# Patient Record
Sex: Male | Born: 1980 | Race: Black or African American | Hispanic: No | Marital: Single | Smoking: Former smoker
Health system: Southern US, Community
[De-identification: ages and names within clinical notes are randomized; demographics above are authoritative.]

## PROBLEM LIST (undated history)

## (undated) DIAGNOSIS — C029 Malignant neoplasm of tongue, unspecified: Secondary | ICD-10-CM

## (undated) HISTORY — PX: TONGUE SURGERY: SHX810

---
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
## 2000-10-22 ENCOUNTER — Emergency Department (HOSPITAL_COMMUNITY): Admission: EM | Admit: 2000-10-22 | Discharge: 2000-10-22 | Payer: Self-pay | Admitting: Emergency Medicine

## 2005-09-08 ENCOUNTER — Emergency Department (HOSPITAL_COMMUNITY): Admission: EM | Admit: 2005-09-08 | Discharge: 2005-09-08 | Payer: Self-pay | Admitting: Family Medicine

## 2006-06-27 ENCOUNTER — Emergency Department (HOSPITAL_COMMUNITY): Admission: EM | Admit: 2006-06-27 | Discharge: 2006-06-27 | Payer: Self-pay | Admitting: Emergency Medicine

## 2008-11-05 ENCOUNTER — Emergency Department (HOSPITAL_COMMUNITY): Admission: EM | Admit: 2008-11-05 | Discharge: 2008-11-05 | Payer: Self-pay | Admitting: Family Medicine

## 2008-11-21 ENCOUNTER — Emergency Department (HOSPITAL_COMMUNITY): Admission: EM | Admit: 2008-11-21 | Discharge: 2008-11-21 | Payer: Self-pay | Admitting: Emergency Medicine

## 2008-11-26 ENCOUNTER — Emergency Department (HOSPITAL_COMMUNITY): Admission: EM | Admit: 2008-11-26 | Discharge: 2008-11-26 | Payer: Self-pay | Admitting: Family Medicine

## 2010-04-18 ENCOUNTER — Emergency Department (HOSPITAL_COMMUNITY): Admission: EM | Admit: 2010-04-18 | Discharge: 2010-04-18 | Payer: Self-pay | Admitting: Emergency Medicine

## 2010-09-25 ENCOUNTER — Emergency Department (HOSPITAL_COMMUNITY)
Admission: EM | Admit: 2010-09-25 | Discharge: 2010-09-25 | Discharge status: Against Medical Advice | Payer: Self-pay | Attending: Emergency Medicine | Admitting: Emergency Medicine

## 2010-09-25 DIAGNOSIS — K047 Periapical abscess without sinus: Secondary | ICD-10-CM | POA: Insufficient documentation

## 2011-01-17 ENCOUNTER — Emergency Department (HOSPITAL_COMMUNITY)
Admission: EM | Admit: 2011-01-17 | Discharge: 2011-01-17 | Disposition: A | Discharge status: Home / Self Care | Payer: Self-pay | Attending: Emergency Medicine | Admitting: Emergency Medicine

## 2011-01-17 DIAGNOSIS — K089 Disorder of teeth and supporting structures, unspecified: Secondary | ICD-10-CM | POA: Insufficient documentation

## 2011-01-17 DIAGNOSIS — H9209 Otalgia, unspecified ear: Secondary | ICD-10-CM | POA: Insufficient documentation

## 2011-01-17 DIAGNOSIS — K029 Dental caries, unspecified: Secondary | ICD-10-CM | POA: Insufficient documentation

## 2011-03-14 ENCOUNTER — Emergency Department (HOSPITAL_COMMUNITY)
Admission: EM | Admit: 2011-03-14 | Discharge: 2011-03-14 | Disposition: A | Discharge status: Home / Self Care | Payer: Self-pay | Attending: Emergency Medicine | Admitting: Emergency Medicine

## 2011-03-14 DIAGNOSIS — K029 Dental caries, unspecified: Secondary | ICD-10-CM | POA: Insufficient documentation

## 2011-03-14 DIAGNOSIS — K089 Disorder of teeth and supporting structures, unspecified: Secondary | ICD-10-CM | POA: Insufficient documentation

## 2011-03-14 DIAGNOSIS — R221 Localized swelling, mass and lump, neck: Secondary | ICD-10-CM | POA: Insufficient documentation

## 2011-03-14 DIAGNOSIS — R220 Localized swelling, mass and lump, head: Secondary | ICD-10-CM | POA: Insufficient documentation

## 2011-03-14 DIAGNOSIS — K047 Periapical abscess without sinus: Secondary | ICD-10-CM | POA: Insufficient documentation

## 2011-07-04 ENCOUNTER — Emergency Department (HOSPITAL_COMMUNITY)
Admission: EM | Admit: 2011-07-04 | Discharge: 2011-07-04 | Disposition: A | Discharge status: Home / Self Care | Payer: Self-pay | Attending: Emergency Medicine | Admitting: Emergency Medicine

## 2011-07-04 ENCOUNTER — Encounter (HOSPITAL_COMMUNITY): Payer: Self-pay | Admitting: *Deleted

## 2011-07-04 DIAGNOSIS — R6884 Jaw pain: Secondary | ICD-10-CM | POA: Insufficient documentation

## 2011-07-04 DIAGNOSIS — R220 Localized swelling, mass and lump, head: Secondary | ICD-10-CM | POA: Insufficient documentation

## 2011-07-04 DIAGNOSIS — K029 Dental caries, unspecified: Secondary | ICD-10-CM | POA: Insufficient documentation

## 2011-07-04 DIAGNOSIS — K047 Periapical abscess without sinus: Secondary | ICD-10-CM | POA: Insufficient documentation

## 2011-07-04 MED ORDER — PENICILLIN V POTASSIUM 500 MG PO TABS: 500.0000 mg | ORAL_TABLET | Freq: Four times a day (QID) | ORAL | Status: AC

## 2011-07-04 MED ORDER — OXYCODONE-ACETAMINOPHEN 5-325 MG PO TABS: 2.0000 | ORAL_TABLET | ORAL | Status: AC | PRN

## 2011-07-04 NOTE — ED Notes (Signed)
Pt had right lower denatl pain for the last couple of days,  Now he has began having swelling of right jaw.  No sob with this.  Pt reports chills, no fever.

## 2011-07-04 NOTE — ED Notes (Signed)
Pt states that he had pain in his right molar that started approx 3 days ago. Pt has noted darkening and deformity in the tooth. Pt has swelling in jaw that started today and pain that goes into his neck.

## 2011-07-04 NOTE — ED Provider Notes (Signed)
History     CSN: [id number]  Arrival date & time 07/04/11  1312   First MD Initiated Contact with Patient 07/04/11 1501      Chief Complaint  Patient presents with  . Jaw Pain    (Consider location/radiation/quality/duration/timing/severity/associated sxs/prior treatment) The history is provided by the patient.   patient here with right lower jaw pain for the past 3-4 days. Pain to his right lower second molar. No fever or pain is described as sharp and worse with chewing. Some jaw swelling down to the right lower face. No medications used prior to arrival  History reviewed. No pertinent past medical history.  History reviewed. No pertinent past surgical history.  No family history on file.  History  Substance Use Topics  . Smoking status: Current Everyday Smoker    Types: Cigarettes  . Smokeless tobacco: Not on file  . Alcohol Use: Yes      Review of Systems  All other systems reviewed and are negative.    Allergies  Review of patient's allergies indicates no known allergies.  Home Medications   Current Outpatient Rx  Name Route Sig Dispense Refill  . BENZOCAINE 10 % MT GEL Mouth/Throat Use as directed 1 application in the mouth or throat every hour as needed. For pain    . IBUPROFEN 200 MG PO TABS Oral Take 600 mg by mouth every 6 (six) hours as needed. For pain      BP 132/74  Pulse 102  Temp(Src) 98.9 F (37.2 C) (Oral)  Resp 16  SpO2 99%  Physical Exam  Nursing note and vitals reviewed. Constitutional: He is oriented to person, place, and time. He appears well-developed and well-nourished.  Non-toxic appearance.  HENT:  Head: Normocephalic and atraumatic.  Mouth/Throat: Dental abscesses and dental caries present.    Eyes: Conjunctivae are normal. Pupils are equal, round, and reactive to light.  Neck: Normal range of motion.  Cardiovascular: Normal rate.   Pulmonary/Chest: Effort normal.  Neurological: He is alert and oriented to person, place,  and time.  Skin: Skin is warm and dry.  Psychiatric: He has a normal mood and affect.    ED Course  Procedures (including critical care time)  Labs Reviewed - No data to display No results found.   No diagnosis found.    MDM  Patient to be treated for dental abscess        Toy Baker, MD 07/04/11 1600

## 2013-02-20 ENCOUNTER — Encounter (HOSPITAL_COMMUNITY): Payer: Self-pay | Admitting: Emergency Medicine

## 2013-02-20 ENCOUNTER — Emergency Department (HOSPITAL_COMMUNITY)
Admission: EM | Admit: 2013-02-20 | Discharge: 2013-02-20 | Disposition: A | Discharge status: Home / Self Care | Payer: Self-pay | Attending: Emergency Medicine | Admitting: Emergency Medicine

## 2013-02-20 DIAGNOSIS — H9202 Otalgia, left ear: Secondary | ICD-10-CM

## 2013-02-20 DIAGNOSIS — H902 Conductive hearing loss, unspecified: Secondary | ICD-10-CM | POA: Insufficient documentation

## 2013-02-20 DIAGNOSIS — Y939 Activity, unspecified: Secondary | ICD-10-CM | POA: Insufficient documentation

## 2013-02-20 DIAGNOSIS — IMO0002 Reserved for concepts with insufficient information to code with codable children: Secondary | ICD-10-CM | POA: Insufficient documentation

## 2013-02-20 DIAGNOSIS — T169XXA Foreign body in ear, unspecified ear, initial encounter: Secondary | ICD-10-CM | POA: Insufficient documentation

## 2013-02-20 DIAGNOSIS — H9209 Otalgia, unspecified ear: Secondary | ICD-10-CM | POA: Insufficient documentation

## 2013-02-20 DIAGNOSIS — J029 Acute pharyngitis, unspecified: Secondary | ICD-10-CM | POA: Insufficient documentation

## 2013-02-20 DIAGNOSIS — F17200 Nicotine dependence, unspecified, uncomplicated: Secondary | ICD-10-CM | POA: Insufficient documentation

## 2013-02-20 DIAGNOSIS — Y929 Unspecified place or not applicable: Secondary | ICD-10-CM | POA: Insufficient documentation

## 2013-02-20 DIAGNOSIS — T162XXA Foreign body in left ear, initial encounter: Secondary | ICD-10-CM

## 2013-02-20 MED ORDER — CETIRIZINE-PSEUDOEPHEDRINE ER 5-120 MG PO TB12: 1.0000 | ORAL_TABLET | Freq: Two times a day (BID) | ORAL | Status: DC

## 2013-02-20 MED ORDER — OXYMETAZOLINE HCL 0.05 % NA SOLN: 2.0000 | Freq: Two times a day (BID) | NASAL | Status: DC

## 2013-02-20 MED ORDER — HYDROCODONE-ACETAMINOPHEN 5-325 MG PO TABS
1.0000 | ORAL_TABLET | Freq: Once | ORAL | Status: AC
  Administered 2013-02-20: 1 via ORAL
  Filled 2013-02-20: qty 1

## 2013-02-20 MED ORDER — HYDROCODONE-ACETAMINOPHEN 5-325 MG PO TABS: 1.0000 | ORAL_TABLET | ORAL | Status: DC | PRN

## 2013-02-20 MED ORDER — AMOXICILLIN 500 MG PO CAPS: 500.0000 mg | ORAL_CAPSULE | Freq: Three times a day (TID) | ORAL | Status: DC

## 2013-02-20 NOTE — ED Provider Notes (Addendum)
CSN: [id number]     Arrival date & time 02/20/13  0024 History   First MD Initiated Contact with Patient 02/20/13 0034     Chief Complaint  Patient presents with  . Otalgia   HPI  History provided by the patient. The patient is a 32 year old male with no symptoms PMH presents with complaints of persistent and worsening left ear pain and discomfort. Patient reports initially having some left ear pains and discomforts one to 2 weeks ago with associated sore throat. His sore throat did improve slightly and he had some waxing and waning improvements of his ear. Over the last 2-3 days he has had significantly worsening left ear pain. Patient does report putting cotton ball and is here last night and believes there is still some cotton remaining in his ear. He has not had any drainage from the ear. He was using ibuprofen very frequently for the pain without much relief.   History reviewed. No pertinent past medical history. History reviewed. No pertinent past surgical history. No family history on file. History  Substance Use Topics  . Smoking status: Current Every Day Smoker    Types: Cigarettes  . Smokeless tobacco: Not on file  . Alcohol Use: Yes    Review of Systems  Constitutional: Negative for fever, chills and diaphoresis.  HENT: Positive for hearing loss, ear pain and sore throat. Negative for congestion, rhinorrhea, tinnitus and ear discharge.   Respiratory: Negative for cough.   All other systems reviewed and are negative.    Allergies  Review of patient's allergies indicates no known allergies.  Home Medications   Current Outpatient Rx  Name  Route  Sig  Dispense  Refill  . benzocaine (ORAJEL) 10 % mucosal gel   Mouth/Throat   Use as directed 1 application in the mouth or throat every hour as needed. For pain         . ibuprofen (ADVIL,MOTRIN) 200 MG tablet   Oral   Take 600 mg by mouth every 6 (six) hours as needed. For pain          BP 122/72  Temp(Src) 99 F  (37.2 C) (Oral)  Resp 14  SpO2 99% Physical Exam  Nursing note and vitals reviewed. Constitutional: He is oriented to person, place, and time. He appears well-developed and well-nourished.  HENT:  Head: Normocephalic and atraumatic.  Right Ear: Tympanic membrane and external ear normal.  Nose: Nose normal.  Mouth/Throat: Oropharynx is clear and moist.  Cotton in the external left auditory canal obstructing view of the TM.    After removal TM does not show any signs of perforation. There is erythema and bulging without purulent middle ear effusion.  Eyes: Conjunctivae and EOM are normal. Pupils are equal, round, and reactive to light.  Cardiovascular: Normal rate and regular rhythm.   Pulmonary/Chest: Effort normal and breath sounds normal. No respiratory distress. He has no wheezes. He has no rales.  Abdominal: Soft.  Musculoskeletal: Normal range of motion.  Neurological: He is alert and oriented to person, place, and time.  Skin: Skin is warm.  Psychiatric: He has a normal mood and affect. His behavior is normal.    ED Course  FOREIGN BODY REMOVAL Date/Time: 02/20/2013 12:45 AM Performed by: Angus Seller Authorized by: Angus Seller Consent: Verbal consent obtained. Risks and benefits: risks, benefits and alternatives were discussed Consent given by: patient Patient understanding: patient states understanding of the procedure being performed Patient consent: the patient's understanding of the procedure  matches consent given Patient identity confirmed: verbally with patient Time out: Immediately prior to procedure a "time out" was called to verify the correct patient, procedure, equipment, support staff and site/side marked as required. Body area: ear Location details: left ear Localization method: visualized Removal mechanism: alligator forceps 1 objects recovered. Objects recovered: Cotton ball/ Q-tip Post-procedure assessment: foreign body removed Patient  tolerance: Patient tolerated the procedure well with no immediate complications.        MDM   1. Otalgia of left ear   2. Foreign body of ear, left, initial encounter      12:30 AM patient seen and evaluated. Patient appears well in no acute distress. Does not appear severely ill or toxic.    Angus Seller, PA-C 02/20/13 0435    Angus Seller, PA-C 03/02/13 2008

## 2013-02-20 NOTE — ED Notes (Signed)
Pt. reports left earache for 2 days , denies injury , no drainage.

## 2013-02-20 NOTE — ED Notes (Signed)
Left ear pain ongoing for months but worsened 2 days ago.  Area around ear now painful as well.  Pain untouched by tylenol. Denies ear drainage.  Admits to using cotton tips to clean ears.   10/10 pain per pt.

## 2013-02-21 NOTE — ED Provider Notes (Signed)
Medical screening examination/treatment/procedure(s) were performed by non-physician practitioner and as supervising physician I was immediately available for consultation/collaboration.   Candyce Churn, MD 02/21/13 [phone]

## 2013-03-03 NOTE — ED Provider Notes (Signed)
Medical screening examination/treatment/procedure(s) were performed by non-physician practitioner and as supervising physician I was immediately available for consultation/collaboration.   Candyce Churn, MD 03/03/13 [phone]

## 2014-01-04 ENCOUNTER — Emergency Department (HOSPITAL_COMMUNITY)
Admission: EM | Admit: 2014-01-04 | Discharge: 2014-01-04 | Disposition: A | Discharge status: Home / Self Care | Payer: Self-pay | Attending: Emergency Medicine | Admitting: Emergency Medicine

## 2014-01-04 ENCOUNTER — Encounter (HOSPITAL_COMMUNITY): Payer: Self-pay | Admitting: Emergency Medicine

## 2014-01-04 ENCOUNTER — Emergency Department (HOSPITAL_COMMUNITY): Payer: Self-pay

## 2014-01-04 DIAGNOSIS — R221 Localized swelling, mass and lump, neck: Principal | ICD-10-CM

## 2014-01-04 DIAGNOSIS — F17200 Nicotine dependence, unspecified, uncomplicated: Secondary | ICD-10-CM | POA: Insufficient documentation

## 2014-01-04 DIAGNOSIS — R220 Localized swelling, mass and lump, head: Secondary | ICD-10-CM | POA: Insufficient documentation

## 2014-01-04 DIAGNOSIS — H9209 Otalgia, unspecified ear: Secondary | ICD-10-CM | POA: Insufficient documentation

## 2014-01-04 DIAGNOSIS — Z792 Long term (current) use of antibiotics: Secondary | ICD-10-CM | POA: Insufficient documentation

## 2014-01-04 DIAGNOSIS — K148 Other diseases of tongue: Secondary | ICD-10-CM

## 2014-01-04 DIAGNOSIS — Z79899 Other long term (current) drug therapy: Secondary | ICD-10-CM | POA: Insufficient documentation

## 2014-01-04 LAB — I-STAT CHEM 8, ED
BUN: 18 mg/dL (ref 6–23)
CREATININE: 1.4 mg/dL — AB (ref 0.50–1.35)
Calcium, Ion: 1.25 mmol/L — ABNORMAL HIGH (ref 1.12–1.23)
Chloride: 107 mEq/L (ref 96–112)
GLUCOSE: 87 mg/dL (ref 70–99)
HCT: 45 % (ref 39.0–52.0)
Hemoglobin: 15.3 g/dL (ref 13.0–17.0)
POTASSIUM: 4.2 meq/L (ref 3.7–5.3)
Sodium: 140 mEq/L (ref 137–147)
TCO2: 25 mmol/L (ref 0–100)

## 2014-01-04 MED ORDER — OXYCODONE-ACETAMINOPHEN 5-325 MG PO TABS: 1.0000 | ORAL_TABLET | ORAL | Status: DC | PRN

## 2014-01-04 MED ORDER — IOHEXOL 300 MG/ML  SOLN
80.0000 mL | Freq: Once | INTRAMUSCULAR | Status: AC | PRN
  Administered 2014-01-04: 80 mL via INTRAVENOUS

## 2014-01-04 MED ORDER — SODIUM CHLORIDE 0.9 % IV BOLUS (SEPSIS)
1000.0000 mL | Freq: Once | INTRAVENOUS | Status: AC
  Administered 2014-01-04: 1000 mL via INTRAVENOUS

## 2014-01-04 MED ORDER — IBUPROFEN 800 MG PO TABS: 800.0000 mg | ORAL_TABLET | Freq: Three times a day (TID) | ORAL | Status: DC

## 2014-01-04 NOTE — ED Notes (Signed)
Patient states he has been having L ear pain x 1 year.  Patient states he has been seen 2 x for same.  Patient states that he gets sick when he takes pain medication.

## 2014-01-04 NOTE — Discharge Instructions (Signed)
Call Dr. Janace Hoard or another Ear, Nose, Throat specialist for further evaluation of your neck/tongue mass. Call for a follow up appointment with a Family or Primary Care Provider.  Return if Symptoms worsen.   Take medication as prescribed.  Stop smoking cigarettes.

## 2014-01-04 NOTE — ED Provider Notes (Signed)
CSN: [id number]     Arrival date & time 01/04/14  1159 History  This chart was scribed for non-physician practitioner, Harvie Heck, PA-C working with Orpah Greek, MD by Frederich Balding, ED scribe. This patient was seen in room TR08C/TR08C and the patient's care was started at 12:59 PM.   Chief Complaint  Patient presents with  . Otalgia   HPI Comments: Joel Mathis is a 33 y.o. male who presents to the Emergency Department complaining of constant left ear pain that started one year ago. Reports the pain never subsides. States he has been seen twice for the same in the past and told nothing was wrong with his ear. Pt does not have a PCP and has not been evaluated by ENT. Denies fever, chills, ear discharge, sore throat, pain with swallowing. Pt smokes cigarettes daily.  The history is provided by the patient. No language interpreter was used.    History reviewed. No pertinent past medical history. History reviewed. No pertinent past surgical history. No family history on file. History  Substance Use Topics  . Smoking status: Current Every Day Smoker    Types: Cigarettes  . Smokeless tobacco: Not on file  . Alcohol Use: Yes    Review of Systems  Constitutional: Negative for fever and chills.  HENT: Positive for ear pain. Negative for ear discharge and sore throat.   All other systems reviewed and are negative.  Allergies  Review of patient's allergies indicates no known allergies.  Home Medications   Prior to Admission medications   Medication Sig Start Date End Date Taking? Authorizing Provider  amoxicillin (AMOXIL) 500 MG capsule Take 1 capsule (500 mg total) by mouth 3 (three) times daily. 02/20/13   Ruthell Rummage Dammen, PA-C  cetirizine-pseudoephedrine (ZYRTEC-D) 5-120 MG per tablet Take 1 tablet by mouth 2 (two) times daily. 02/20/13   Ruthell Rummage Dammen, PA-C  HYDROcodone-acetaminophen (NORCO) 5-325 MG per tablet Take 1 tablet by mouth every 4 (four) hours as needed for  pain. 02/20/13   Ruthell Rummage Dammen, PA-C  oxymetazoline (AFRIN NASAL SPRAY) 0.05 % nasal spray Place 2 sprays into the nose 2 (two) times daily. 02/20/13   Ruthell Rummage Dammen, PA-C   BP 115/80  Pulse 82  Temp(Src) 97.6 F (36.4 C)  Resp 16  SpO2 100%  Physical Exam  Nursing note and vitals reviewed. Constitutional: He is oriented to person, place, and time. He appears well-developed and well-nourished.  Non-toxic appearance. He does not have a sickly appearance. He does not appear ill. No distress.  HENT:  Head: Normocephalic and atraumatic.  Left Ear: External ear normal.  Nose: Nose normal.  Right TM is completely occluded by cerumen. Left posterior oropharynx swelling without exudate.   Eyes: Conjunctivae and EOM are normal.  Neck: Neck supple.  Cardiovascular: Normal rate.   Pulmonary/Chest: Effort normal. No respiratory distress.  Musculoskeletal: Normal range of motion.  Lymphadenopathy:       Head (right side): No submandibular, no tonsillar and no preauricular adenopathy present.       Head (left side): No submandibular, no tonsillar and no preauricular adenopathy present.  No lymphadenopathy.   Neurological: He is alert and oriented to person, place, and time.  Skin: Skin is warm and dry. He is not diaphoretic.  Psychiatric: He has a normal mood and affect. His behavior is normal.    ED Course  Procedures (including critical care time)  COORDINATION OF CARE: 1:03 PM-Discussed treatment plan which includes ENT follow up with  pt at bedside and pt agreed to plan.   Labs Review Labs Reviewed  I-STAT CHEM 8, ED - Abnormal; Notable for the following:    Creatinine, Ser 1.40 (*)    Calcium, Ion 1.25 (*)    All other components within normal limits    Imaging Review Ct Soft Tissue Neck W Contrast  01/04/2014   CLINICAL DATA:  Left ear pain.  EXAM: CT NECK WITH CONTRAST  TECHNIQUE: Multidetector CT imaging of the neck was performed using the standard protocol following the  bolus administration of intravenous contrast.  CONTRAST:  28mL OMNIPAQUE IOHEXOL 300 MG/ML  SOLN  COMPARISON:  None available.  FINDINGS: A well-defined base of tongue lesion measures 1.5 x 1.4 x 1.6 cm. Benign appearing submandibular lymph nodes measure up to 9 x 17 mm on the right and 9 x 16 mm on the left. Submandibular glands and parotid glands are within normal limits bilaterally.  Mild prominence of the jugulodigastric lymph nodes is symmetric. A single and large posterior left level 2 lymph node is ovoid, measuring 16 x 13 x 9 mm. No other significant adenopathy is present.  No other focal mucosal a submucosal lesions are present within the suprahyoid neck. The lesion does not extend into the pre epiglottic fat.  Multiple dental caries are evident bilaterally. There is significant periapical disease involving the right mandibular second and third molars (teeth #30 & 31). The more anterior lesion demonstrates significant cortical erosion of the lateral margin of the mandible.  Apart from the dental disease, the bone windows are unremarkable. The lung apices are clear.  IMPRESSION: 1. Well-defined hyperdense, likely enhancing 1.5 x 1.4 x 1.6 cm lesion at the left tongue base. The differential diagnosis includes a well-defined squamous cell carcinoma. Ectopic thyroid tissue is considered less likely given the paramedian location. 2. Borderline submandibular and level 2 lymph nodes as described. No clearly malignant nodes are evident. 3. No other significant submandibular disease.   Electronically Signed   By: Lawrence Santiago M.D.   On: 01/04/2014 15:44     EKG Interpretation None      MDM   Final diagnoses:  Tongue mass   Patient presents with left ear pain no sign of infection on exam. Left-sided posterior oropharynx with large mass. CT shows a well-defined hyperdense lesion at the left tongue base. All discussed CT results and need to follow up with ENT specialist the patient. Call Dr. Janace Hoard  office to inform staff the patient will be following up with them. Discussed lab results, imaging results, and treatment plan with the patient. Return precautions given. Reports understanding and no other concerns at this time.  Patient is stable for discharge at this time. Meds given in ED:  Medications  sodium chloride 0.9 % bolus 1,000 mL (1,000 mLs Intravenous New Bag/Given 01/04/14 1337)  iohexol (OMNIPAQUE) 300 MG/ML solution 80 mL (80 mLs Intravenous Contrast Given 01/04/14 1458)    Discharge Medication List as of 01/04/2014  4:23 PM    START taking these medications   Details  !! ibuprofen (ADVIL,MOTRIN) 800 MG tablet Take 1 tablet (800 mg total) by mouth 3 (three) times daily., Starting 01/04/2014, Until Discontinued, Print    oxyCODONE-acetaminophen (PERCOCET) 5-325 MG per tablet Take 1 tablet by mouth every 4 (four) hours as needed., Starting 01/04/2014, Until Discontinued, Print     !! - Potential duplicate medications found. Please discuss with provider.     I personally performed the services described in this documentation, which was  scribed in my presence. The recorded information has been reviewed and is accurate.  Harvie Heck, PA-C 01/05/14 1614

## 2014-01-07 NOTE — ED Provider Notes (Signed)
Medical screening examination/treatment/procedure(s) were performed by non-physician practitioner and as supervising physician I was immediately available for consultation/collaboration.  Orpah Greek, MD 01/07/14 [phone]

## 2014-01-27 ENCOUNTER — Encounter (HOSPITAL_COMMUNITY): Payer: Self-pay | Admitting: Emergency Medicine

## 2014-01-27 ENCOUNTER — Emergency Department (HOSPITAL_COMMUNITY)
Admission: EM | Admit: 2014-01-27 | Discharge: 2014-01-27 | Disposition: A | Discharge status: Home / Self Care | Payer: Self-pay | Attending: Emergency Medicine | Admitting: Emergency Medicine

## 2014-01-27 DIAGNOSIS — R220 Localized swelling, mass and lump, head: Secondary | ICD-10-CM | POA: Insufficient documentation

## 2014-01-27 DIAGNOSIS — Z791 Long term (current) use of non-steroidal anti-inflammatories (NSAID): Secondary | ICD-10-CM | POA: Insufficient documentation

## 2014-01-27 DIAGNOSIS — F17200 Nicotine dependence, unspecified, uncomplicated: Secondary | ICD-10-CM | POA: Insufficient documentation

## 2014-01-27 DIAGNOSIS — L723 Sebaceous cyst: Secondary | ICD-10-CM | POA: Insufficient documentation

## 2014-01-27 DIAGNOSIS — R221 Localized swelling, mass and lump, neck: Secondary | ICD-10-CM

## 2014-01-27 MED ORDER — ACETAMINOPHEN 500 MG PO TABS
1000.0000 mg | ORAL_TABLET | Freq: Once | ORAL | Status: AC
  Administered 2014-01-27: 1000 mg via ORAL
  Filled 2014-01-27: qty 2

## 2014-01-27 NOTE — Discharge Instructions (Signed)
Keep the area clean and apply warm compresses to the area. Refer to attached documents for more information. Follow up with Dr. Constance Holster if symptoms do not resolve.

## 2014-01-27 NOTE — ED Notes (Signed)
[name] at the bedside.

## 2014-01-27 NOTE — ED Provider Notes (Signed)
Medical screening examination/treatment/procedure(s) were performed by non-physician practitioner and as supervising physician I was immediately available for consultation/collaboration.   EKG Interpretation None       Kalman Drape, MD 01/27/14 [phone]

## 2014-01-27 NOTE — ED Notes (Signed)
Pt presents for evaluation of swelling to the lobe of right ear since yesterday, denies any pain or drainage.  Obvious swelling noted.  Denies changes in hearing.

## 2014-01-27 NOTE — ED Provider Notes (Signed)
CSN: [id number]     Arrival date & time 01/27/14  0143 History   First MD Initiated Contact with Patient 01/27/14 0211     Chief Complaint  Patient presents with  . Facial Swelling     (Consider location/radiation/quality/duration/timing/severity/associated sxs/prior Treatment) HPI Comments: Patient is a 33 year old male who presents with a 2 day history of right ear swelling and pain. Symptoms started gradually and progressively worsened since the onset. The pain is throbbing and severe without radiation. Patient has not tried anything at home for symptom relief. Patient reports he wanted to put a pin in it to drain it. Palpation makes the pain worse. No alleviating factors.    History reviewed. No pertinent past medical history. History reviewed. No pertinent past surgical history. No family history on file. History  Substance Use Topics  . Smoking status: Current Every Day Smoker    Types: Cigarettes  . Smokeless tobacco: Not on file  . Alcohol Use: Yes    Review of Systems  Constitutional: Negative for fever, chills and fatigue.  HENT: Positive for ear pain. Negative for trouble swallowing.   Eyes: Negative for visual disturbance.  Respiratory: Negative for shortness of breath.   Cardiovascular: Negative for chest pain and palpitations.  Gastrointestinal: Negative for nausea, vomiting, abdominal pain and diarrhea.  Genitourinary: Negative for dysuria and difficulty urinating.  Musculoskeletal: Negative for arthralgias and neck pain.  Skin: Negative for color change.  Neurological: Negative for dizziness and weakness.  Psychiatric/Behavioral: Negative for dysphoric mood.      Allergies  Review of patient's allergies indicates no known allergies.  Home Medications   Prior to Admission medications   Medication Sig Start Date End Date Taking? Authorizing Provider  naproxen sodium (ANAPROX) 220 MG tablet Take 220 mg by mouth 2 (two) times daily as needed (for pain).   Yes  Historical Provider, MD   BP 145/91  Pulse 98  Temp(Src) 97.4 F (36.3 C) (Oral)  Resp 18  Ht 6' (1.829 m)  Wt 175 lb (79.379 kg)  BMI 23.73 kg/m2  SpO2 99% Physical Exam  Nursing note and vitals reviewed. Constitutional: He is oriented to person, place, and time. He appears well-developed and well-nourished. No distress.  HENT:  Head: Normocephalic and atraumatic.    Left Ear: External ear normal.  Right auricle fluctuant mass without drainage noted. Tenderness to palpation of the area.   Eyes: Conjunctivae and EOM are normal.  Neck: Normal range of motion.  Cardiovascular: Normal rate and regular rhythm.  Exam reveals no gallop and no friction rub.   No murmur heard. Pulmonary/Chest: Effort normal and breath sounds normal. He has no wheezes. He has no rales. He exhibits no tenderness.  Musculoskeletal: Normal range of motion.  Neurological: He is alert and oriented to person, place, and time. Coordination normal.  Speech is goal-oriented. Moves limbs without ataxia.   Skin: Skin is warm and dry.  Psychiatric: He has a normal mood and affect. His behavior is normal.    ED Course  Procedures (including critical care time) Labs Review Labs Reviewed - No data to display  INCISION AND DRAINAGE Performed by: Alvina Chou Consent: Verbal consent obtained. Risks and benefits: risks, benefits and alternatives were discussed Type: abscess  Body area: right auricle  Anesthesia: local infiltration  Incision was made with a scalpel.  Local anesthetic: topical cetacaine  Anesthetic total: 4 sprays  Complexity: complex Blunt dissection to break up loculations  Drainage: sebaceous  Drainage amount: 2 mL  Packing material: none  Patient tolerance: Patient tolerated the procedure well with no immediate complications.     Imaging Review No results found.   EKG Interpretation None      MDM   Final diagnoses:  Sebaceous cyst of ear    3:11  AM Patient had sebaceous cyst of right auricle that was drained. Vitals stable and patient afebrile. Patient instructed to follow up with ENT as needed. No further evaluation needed at this time.     Alvina Chou, [place] 01/27/14 [phone]

## 2016-01-16 ENCOUNTER — Emergency Department (HOSPITAL_COMMUNITY)
Admission: EM | Admit: 2016-01-16 | Discharge: 2016-01-16 | Disposition: A | Discharge status: Home / Self Care | Payer: Self-pay | Attending: Emergency Medicine | Admitting: Emergency Medicine

## 2016-01-16 ENCOUNTER — Encounter (HOSPITAL_COMMUNITY): Payer: Self-pay | Admitting: *Deleted

## 2016-01-16 DIAGNOSIS — Z202 Contact with and (suspected) exposure to infections with a predominantly sexual mode of transmission: Secondary | ICD-10-CM | POA: Insufficient documentation

## 2016-01-16 DIAGNOSIS — R369 Urethral discharge, unspecified: Secondary | ICD-10-CM | POA: Insufficient documentation

## 2016-01-16 DIAGNOSIS — F17210 Nicotine dependence, cigarettes, uncomplicated: Secondary | ICD-10-CM | POA: Insufficient documentation

## 2016-01-16 LAB — URINE MICROSCOPIC-ADD ON: RBC / HPF: NONE SEEN RBC/hpf (ref 0–5)

## 2016-01-16 LAB — URINALYSIS, ROUTINE W REFLEX MICROSCOPIC
Glucose, UA: NEGATIVE mg/dL
HGB URINE DIPSTICK: NEGATIVE
Ketones, ur: 15 mg/dL — AB
Nitrite: NEGATIVE
PH: 5.5 (ref 5.0–8.0)
Protein, ur: 30 mg/dL — AB
SPECIFIC GRAVITY, URINE: 1.034 — AB (ref 1.005–1.030)

## 2016-01-16 MED ORDER — LIDOCAINE HCL (PF) 1 % IJ SOLN
2.0000 mL | Freq: Once | INTRAMUSCULAR | Status: AC
  Administered 2016-01-16: 2 mL

## 2016-01-16 MED ORDER — LIDOCAINE HCL (PF) 1 % IJ SOLN
INTRAMUSCULAR | Status: AC
  Administered 2016-01-16: 23:00:00 2 mL
  Filled 2016-01-16: qty 5

## 2016-01-16 MED ORDER — AZITHROMYCIN 250 MG PO TABS
1000.0000 mg | ORAL_TABLET | Freq: Once | ORAL | Status: AC
  Administered 2016-01-16: 1000 mg via ORAL
  Filled 2016-01-16: qty 4

## 2016-01-16 MED ORDER — CEFTRIAXONE SODIUM 250 MG IJ SOLR
250.0000 mg | Freq: Once | INTRAMUSCULAR | Status: AC
  Administered 2016-01-16: 250 mg via INTRAMUSCULAR
  Filled 2016-01-16: qty 250

## 2016-01-16 NOTE — ED Triage Notes (Signed)
Pt in c/o penile discharge x1-2 days, denies pain, no distress noted
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
## 2016-01-17 LAB — GC/CHLAMYDIA PROBE AMP (~~LOC~~) NOT AT ARMC
Chlamydia: NEGATIVE
Neisseria Gonorrhea: POSITIVE — AB

## 2016-01-17 LAB — HIV ANTIBODY (ROUTINE TESTING W REFLEX): HIV Screen 4th Generation wRfx: NONREACTIVE

## 2016-01-17 LAB — RPR: RPR Ser Ql: NONREACTIVE

## 2016-01-18 ENCOUNTER — Telehealth (HOSPITAL_BASED_OUTPATIENT_CLINIC_OR_DEPARTMENT_OTHER): Payer: Self-pay | Admitting: Emergency Medicine

## 2016-03-14 ENCOUNTER — Telehealth (HOSPITAL_BASED_OUTPATIENT_CLINIC_OR_DEPARTMENT_OTHER): Payer: Self-pay | Admitting: Emergency Medicine

## 2016-03-14 NOTE — Telephone Encounter (Signed)
Lost to followup 

## 2016-03-15 ENCOUNTER — Emergency Department (HOSPITAL_COMMUNITY)
Admission: EM | Admit: 2016-03-15 | Discharge: 2016-03-15 | Disposition: A | Discharge status: Home / Self Care | Payer: Self-pay | Attending: Emergency Medicine | Admitting: Emergency Medicine

## 2016-03-15 ENCOUNTER — Encounter (HOSPITAL_COMMUNITY): Payer: Self-pay | Admitting: Emergency Medicine

## 2016-03-15 DIAGNOSIS — K029 Dental caries, unspecified: Secondary | ICD-10-CM | POA: Insufficient documentation

## 2016-03-15 DIAGNOSIS — K047 Periapical abscess without sinus: Secondary | ICD-10-CM | POA: Insufficient documentation

## 2016-03-15 DIAGNOSIS — F17210 Nicotine dependence, cigarettes, uncomplicated: Secondary | ICD-10-CM | POA: Insufficient documentation

## 2016-03-15 MED ORDER — HYDROCODONE-ACETAMINOPHEN 5-325 MG PO TABS
1.0000 | ORAL_TABLET | Freq: Once | ORAL | Status: AC
  Administered 2016-03-15: 1 via ORAL
  Filled 2016-03-15: qty 1

## 2016-03-15 MED ORDER — NAPROXEN 500 MG PO TABS: 500.0000 mg | ORAL_TABLET | Freq: Two times a day (BID) | ORAL | 0 refills | Status: DC

## 2016-03-15 MED ORDER — AMOXICILLIN 500 MG PO CAPS
500.0000 mg | ORAL_CAPSULE | Freq: Once | ORAL | Status: AC
  Administered 2016-03-15: 500 mg via ORAL
  Filled 2016-03-15: qty 1

## 2016-03-15 MED ORDER — AMOXICILLIN 500 MG PO CAPS: 500.0000 mg | ORAL_CAPSULE | Freq: Three times a day (TID) | ORAL | 0 refills | Status: DC

## 2016-03-15 NOTE — ED Notes (Signed)
Patient given MD Live coupon code [id number].

## 2016-03-15 NOTE — ED Triage Notes (Signed)
Pt. reports left upper dental pain for several days and left ear ache for 1 month .

## 2016-03-15 NOTE — ED Provider Notes (Signed)
[facility] DEPT Provider Note   CSN: [id number] Arrival date & time: 03/15/16  2200 By signing my name below, I, Joel Mathis, attest that this documentation has been prepared under the direction and in the presence of Debroah Baller, [place]. Electronically Signed: Georgette Mathis, ED Scribe. 03/15/16. 11:28 PM.  History   Chief Complaint Chief Complaint  Patient presents with  . Dental Pain  . Otalgia   HPI Comments: Joel Mathis is a 35 y.o. male who presents to the Emergency Department complaining of aching, 8/10 left upper dental pain onset this morning. Pt also has left ear pain onset a couple months ago. Pain is exacerbated with eating and laying down. Pt has taken Goody powder with mild relief. Pt denies fever, chills, or any other associated symptoms.  The history is provided by the patient. No language interpreter was used.  Dental Pain   This is a new problem. The current episode started more than 2 days ago. The problem occurs constantly. The problem has not changed since onset.The pain is at a severity of 8/10. The pain is moderate. He has tried acetaminophen for the symptoms. The treatment provided mild relief.  Otalgia  This is a new problem. The current episode started more than 1 week ago. There is pain in the left ear. The problem occurs daily. The problem has not changed since onset.There has been no fever. The pain is moderate.    History reviewed. No pertinent past medical history.  There are no active problems to display for this patient.   History reviewed. No pertinent surgical history.     Home Medications    Prior to Admission medications   Medication Sig Start Date End Date Taking? Authorizing Provider  amoxicillin (AMOXIL) 500 MG capsule Take 1 capsule (500 mg total) by mouth 3 (three) times daily. 03/15/16   Hope Bunnie Pion, NP  naproxen (NAPROSYN) 500 MG tablet Take 1 tablet (500 mg total) by mouth 2 (two) times daily. 03/15/16   Hope Bunnie Pion, NP  naproxen  sodium (ANAPROX) 220 MG tablet Take 220 mg by mouth 2 (two) times daily as needed (for pain).    Historical Provider, MD    Family History No family history on file.  Social History Social History  Substance Use Topics  . Smoking status: Current Every Day Smoker    Types: Cigarettes  . Smokeless tobacco: Never Used  . Alcohol use Yes     Allergies   Review of patient's allergies indicates no known allergies.   Review of Systems Review of Systems  Constitutional: Negative for chills and fever.  HENT: Positive for dental problem and ear pain.   All other systems reviewed and are negative.    Physical Exam Updated Vital Signs BP 132/74 (BP Location: Right Arm)   Pulse 84   Temp 98 F (36.7 C) (Oral)   Resp 20   Ht 6' (1.829 m)   Wt 74.4 kg   SpO2 100%   BMI 22.24 kg/m   Physical Exam  Constitutional: He appears well-developed and well-nourished.  HENT:  Head: Normocephalic.  Right Ear: Tympanic membrane normal.  Left Ear: Tympanic membrane normal.  Mouth/Throat: Oropharynx is clear and moist. Abnormal dentition. Dental caries present.    Left lower second molar is decayed. Left upper tooth tenderness with gum surrounding tooth tender with erythema and swelling. Anterior cervical lymphadenopathy.   Eyes: Conjunctivae and EOM are normal.  Neck: Neck supple.  Cardiovascular: Normal rate, regular rhythm and normal heart  sounds.  Exam reveals no gallop and no friction rub.   No murmur heard. Pulmonary/Chest: Effort normal and breath sounds normal. No respiratory distress. He has no wheezes. He has no rales.  Abdominal: Soft. He exhibits no distension. There is no tenderness.  Musculoskeletal: Normal range of motion.  Lymphadenopathy:    He has cervical adenopathy.  Neurological: He is alert.  Skin: Skin is warm and dry.  Psychiatric: He has a normal mood and affect. His behavior is normal.  Nursing note and vitals reviewed.    ED Treatments / Results    DIAGNOSTIC STUDIES: Oxygen Saturation is 100% on RA, normal by my interpretation.    COORDINATION OF CARE: 11:27 PM Discussed treatment plan with pt at bedside which includes pain medication and antibiotic Rx and pt agreed to plan.  Labs (all labs ordered are listed, but only abnormal results are displayed) Labs Reviewed - No data to display  Radiology No results found.  Procedures Procedures (including critical care time)  Medications Ordered in ED Medications  amoxicillin (AMOXIL) capsule 500 mg (500 mg Oral Given 03/15/16 2345)  HYDROcodone-acetaminophen (NORCO/VICODIN) 5-325 MG per tablet 1 tablet (1 tablet Oral Given 03/15/16 2345)     Initial Impression / Assessment and Plan / ED Course  I have reviewed the triage vital signs and the nursing notes.  Clinical Course    Final Clinical Impressions(s) / ED Diagnoses   Final diagnoses:  Pain due to dental caries  Dental infection   I personally performed the services described in this documentation, which was scribed in my presence. The recorded information has been reviewed and is accurate.   New Prescriptions Discharge Medication List as of 03/15/2016 11:28 PM    START taking these medications   Details  amoxicillin (AMOXIL) 500 MG capsule Take 1 capsule (500 mg total) by mouth 3 (three) times daily., Starting Thu 03/15/2016, Print    naproxen (NAPROSYN) 500 MG tablet Take 1 tablet (500 mg total) by mouth 2 (two) times daily., Starting Thu 03/15/2016, Print       Patient with dentalgia. No abscess requiring immediate incision and drainage. Exam not concerning for Ludwig's angina or pharyngeal abscess. Will treat with Amoxicillin and Naprosyn. Pt instructed to follow-up with dentist.  Discussed return precautions. Pt safe for discharge.   [address] [place], NP 03/16/16 [id number]    Ripley Fraise, MD 03/16/16 [phone]

## 2017-04-29 ENCOUNTER — Emergency Department (HOSPITAL_COMMUNITY)
Admission: EM | Admit: 2017-04-29 | Discharge: 2017-04-29 | Disposition: A | Discharge status: Home / Self Care | Payer: Medicaid Other | Attending: Emergency Medicine | Admitting: Emergency Medicine

## 2017-04-29 ENCOUNTER — Encounter (HOSPITAL_COMMUNITY): Payer: Self-pay | Admitting: Emergency Medicine

## 2017-04-29 DIAGNOSIS — K146 Glossodynia: Secondary | ICD-10-CM | POA: Insufficient documentation

## 2017-04-29 DIAGNOSIS — R42 Dizziness and giddiness: Secondary | ICD-10-CM | POA: Insufficient documentation

## 2017-04-29 DIAGNOSIS — Z5321 Procedure and treatment not carried out due to patient leaving prior to being seen by health care provider: Secondary | ICD-10-CM | POA: Insufficient documentation

## 2017-04-29 LAB — I-STAT CHEM 8, ED
BUN: 17 mg/dL (ref 6–20)
CHLORIDE: 100 mmol/L — AB (ref 101–111)
Calcium, Ion: 1.18 mmol/L (ref 1.15–1.40)
Creatinine, Ser: 1.2 mg/dL (ref 0.61–1.24)
GLUCOSE: 113 mg/dL — AB (ref 65–99)
HEMATOCRIT: 49 % (ref 39.0–52.0)
HEMOGLOBIN: 16.7 g/dL (ref 13.0–17.0)
Potassium: 4.4 mmol/L (ref 3.5–5.1)
SODIUM: 139 mmol/L (ref 135–145)
TCO2: 26 mmol/L (ref 22–32)

## 2017-04-29 LAB — CBC WITH DIFFERENTIAL/PLATELET
Basophils Absolute: 0 10*3/uL (ref 0.0–0.1)
Basophils Relative: 0 %
EOS ABS: 0.2 10*3/uL (ref 0.0–0.7)
EOS PCT: 2 %
HCT: 43.1 % (ref 39.0–52.0)
Hemoglobin: 13.8 g/dL (ref 13.0–17.0)
LYMPHS ABS: 2.4 10*3/uL (ref 0.7–4.0)
Lymphocytes Relative: 26 %
MCH: 23.6 pg — ABNORMAL LOW (ref 26.0–34.0)
MCHC: 32 g/dL (ref 30.0–36.0)
MCV: 73.7 fL — ABNORMAL LOW (ref 78.0–100.0)
Monocytes Absolute: 0.9 10*3/uL (ref 0.1–1.0)
Monocytes Relative: 10 %
Neutro Abs: 5.7 10*3/uL (ref 1.7–7.7)
Neutrophils Relative %: 62 %
PLATELETS: 347 10*3/uL (ref 150–400)
RBC: 5.85 MIL/uL — AB (ref 4.22–5.81)
RDW: 14.2 % (ref 11.5–15.5)
WBC: 9.2 10*3/uL (ref 4.0–10.5)

## 2017-04-29 LAB — I-STAT CG4 LACTIC ACID, ED: Lactic Acid, Venous: 1.15 mmol/L (ref 0.5–1.9)

## 2017-04-29 NOTE — ED Notes (Signed)
Pt is leaving AMA because he does not want to wait.

## 2017-04-29 NOTE — ED Triage Notes (Signed)
Pt reports he had surgery approx 1 week ago have to a mass removed from back of his tongue. Pt reports inc pain and swelling and concerned he might have post op infection. Surgery done at [facility]

## 2017-04-29 NOTE — ED Notes (Signed)
Pt is complaining of feeling light headed and feels like he is going to pass out. RN notified.

## 2017-05-03 ENCOUNTER — Inpatient Hospital Stay (HOSPITAL_COMMUNITY): Payer: Medicaid Other

## 2017-05-03 ENCOUNTER — Encounter (HOSPITAL_COMMUNITY)
Admission: EM | Disposition: A | Discharge status: Other Acute Inpatient Hospital | Payer: Self-pay | Source: Home / Self Care | Attending: Pulmonary Disease

## 2017-05-03 ENCOUNTER — Emergency Department (HOSPITAL_COMMUNITY): Payer: Medicaid Other

## 2017-05-03 ENCOUNTER — Inpatient Hospital Stay (HOSPITAL_COMMUNITY): Payer: Medicaid Other | Admitting: Certified Registered Nurse Anesthetist

## 2017-05-03 ENCOUNTER — Encounter (HOSPITAL_COMMUNITY): Payer: Self-pay | Admitting: *Deleted

## 2017-05-03 ENCOUNTER — Emergency Department (HOSPITAL_COMMUNITY): Payer: Medicaid Other | Admitting: Certified Registered Nurse Anesthetist

## 2017-05-03 ENCOUNTER — Inpatient Hospital Stay (HOSPITAL_COMMUNITY)
Admission: EM | Admit: 2017-05-03 | Discharge: 2017-05-11 | DRG: 907 | Disposition: A | Discharge status: Other Acute Inpatient Hospital | Payer: Medicaid Other | Attending: Internal Medicine | Admitting: Internal Medicine

## 2017-05-03 ENCOUNTER — Other Ambulatory Visit: Payer: Self-pay

## 2017-05-03 DIAGNOSIS — D649 Anemia, unspecified: Secondary | ICD-10-CM | POA: Diagnosis present

## 2017-05-03 DIAGNOSIS — J9601 Acute respiratory failure with hypoxia: Secondary | ICD-10-CM | POA: Diagnosis present

## 2017-05-03 DIAGNOSIS — Z9289 Personal history of other medical treatment: Secondary | ICD-10-CM

## 2017-05-03 DIAGNOSIS — Z4659 Encounter for fitting and adjustment of other gastrointestinal appliance and device: Secondary | ICD-10-CM

## 2017-05-03 DIAGNOSIS — J189 Pneumonia, unspecified organism: Secondary | ICD-10-CM | POA: Diagnosis present

## 2017-05-03 DIAGNOSIS — C01 Malignant neoplasm of base of tongue: Secondary | ICD-10-CM | POA: Diagnosis present

## 2017-05-03 DIAGNOSIS — E876 Hypokalemia: Secondary | ICD-10-CM | POA: Diagnosis not present

## 2017-05-03 DIAGNOSIS — Y848 Other medical procedures as the cause of abnormal reaction of the patient, or of later complication, without mention of misadventure at the time of the procedure: Secondary | ICD-10-CM | POA: Diagnosis not present

## 2017-05-03 DIAGNOSIS — N179 Acute kidney failure, unspecified: Secondary | ICD-10-CM | POA: Diagnosis present

## 2017-05-03 DIAGNOSIS — D72829 Elevated white blood cell count, unspecified: Secondary | ICD-10-CM | POA: Diagnosis present

## 2017-05-03 DIAGNOSIS — R578 Other shock: Secondary | ICD-10-CM | POA: Diagnosis present

## 2017-05-03 DIAGNOSIS — Z87891 Personal history of nicotine dependence: Secondary | ICD-10-CM | POA: Diagnosis not present

## 2017-05-03 DIAGNOSIS — N289 Disorder of kidney and ureter, unspecified: Secondary | ICD-10-CM

## 2017-05-03 DIAGNOSIS — Z85810 Personal history of malignant neoplasm of tongue: Secondary | ICD-10-CM

## 2017-05-03 DIAGNOSIS — C029 Malignant neoplasm of tongue, unspecified: Secondary | ICD-10-CM

## 2017-05-03 DIAGNOSIS — D62 Acute posthemorrhagic anemia: Secondary | ICD-10-CM

## 2017-05-03 DIAGNOSIS — Z9889 Other specified postprocedural states: Secondary | ICD-10-CM

## 2017-05-03 DIAGNOSIS — Z452 Encounter for adjustment and management of vascular access device: Secondary | ICD-10-CM

## 2017-05-03 DIAGNOSIS — R633 Feeding difficulties, unspecified: Secondary | ICD-10-CM

## 2017-05-03 DIAGNOSIS — I10 Essential (primary) hypertension: Secondary | ICD-10-CM | POA: Diagnosis not present

## 2017-05-03 DIAGNOSIS — Y95 Nosocomial condition: Secondary | ICD-10-CM | POA: Diagnosis not present

## 2017-05-03 DIAGNOSIS — Z23 Encounter for immunization: Secondary | ICD-10-CM

## 2017-05-03 DIAGNOSIS — K920 Hematemesis: Secondary | ICD-10-CM | POA: Diagnosis present

## 2017-05-03 DIAGNOSIS — J9690 Respiratory failure, unspecified, unspecified whether with hypoxia or hypercapnia: Secondary | ICD-10-CM

## 2017-05-03 DIAGNOSIS — K1379 Other lesions of oral mucosa: Secondary | ICD-10-CM

## 2017-05-03 DIAGNOSIS — K91840 Postprocedural hemorrhage and hematoma of a digestive system organ or structure following a digestive system procedure: Principal | ICD-10-CM | POA: Diagnosis present

## 2017-05-03 DIAGNOSIS — R040 Epistaxis: Secondary | ICD-10-CM | POA: Diagnosis present

## 2017-05-03 HISTORY — PX: RADIOLOGY WITH ANESTHESIA: SHX6223

## 2017-05-03 HISTORY — DX: Malignant neoplasm of tongue, unspecified: C02.9

## 2017-05-03 HISTORY — PX: IR NEURO EACH ADD'L AFTER BASIC UNI RIGHT (MS): IMG5374

## 2017-05-03 HISTORY — PX: IR NEURO EACH ADD'L AFTER BASIC UNI LEFT (MS): IMG5373

## 2017-05-03 HISTORY — PX: IR ANGIO INTRA EXTRACRAN SEL COM CAROTID INNOMINATE BILAT MOD SED: IMG5360

## 2017-05-03 HISTORY — PX: IR TRANSCATH/EMBOLIZ: IMG695

## 2017-05-03 HISTORY — PX: IR ANGIO VERTEBRAL SEL VERTEBRAL BILAT MOD SED: IMG5369

## 2017-05-03 HISTORY — PX: IR ANGIOGRAM FOLLOW UP STUDY: IMG697

## 2017-05-03 HISTORY — PX: IR ANGIO EXTERNAL CAROTID SEL EXT CAROTID BILAT MOD SED: IMG5372

## 2017-05-03 LAB — GLUCOSE, CAPILLARY
GLUCOSE-CAPILLARY: 96 mg/dL (ref 65–99)
Glucose-Capillary: 101 mg/dL — ABNORMAL HIGH (ref 65–99)

## 2017-05-03 LAB — RAPID URINE DRUG SCREEN, HOSP PERFORMED
AMPHETAMINES: NOT DETECTED
Barbiturates: NOT DETECTED
Benzodiazepines: POSITIVE — AB
Cocaine: NOT DETECTED
OPIATES: NOT DETECTED
TETRAHYDROCANNABINOL: NOT DETECTED

## 2017-05-03 LAB — PROTIME-INR
INR: 1.22
INR: 1.16
INR: 1.15
PROTHROMBIN TIME: 15.3 s — AB (ref 11.4–15.2)
PROTHROMBIN TIME: 14.6 s (ref 11.4–15.2)
Prothrombin Time: 14.8 seconds (ref 11.4–15.2)

## 2017-05-03 LAB — BASIC METABOLIC PANEL
ANION GAP: 9 (ref 5–15)
BUN: 18 mg/dL (ref 6–20)
CALCIUM: 8.9 mg/dL (ref 8.9–10.3)
CO2: 23 mmol/L (ref 22–32)
CREATININE: 1.48 mg/dL — AB (ref 0.61–1.24)
Chloride: 104 mmol/L (ref 101–111)
GFR, EST NON AFRICAN AMERICAN: 59 mL/min — AB (ref >60)
Glucose, Bld: 140 mg/dL — ABNORMAL HIGH (ref 65–99)
Potassium: 4.2 mmol/L (ref 3.5–5.1)
Sodium: 136 mmol/L (ref 135–145)

## 2017-05-03 LAB — CBC
HCT: 33.6 % — ABNORMAL LOW (ref 39.0–52.0)
HCT: 27.8 % — ABNORMAL LOW (ref 39.0–52.0)
Hemoglobin: 11 g/dL — ABNORMAL LOW (ref 13.0–17.0)
Hemoglobin: 9.4 g/dL — ABNORMAL LOW (ref 13.0–17.0)
MCH: 25.9 pg — AB (ref 26.0–34.0)
MCH: 26 pg (ref 26.0–34.0)
MCHC: 32.7 g/dL (ref 30.0–36.0)
MCHC: 33.8 g/dL (ref 30.0–36.0)
MCV: 79.2 fL (ref 78.0–100.0)
MCV: 77 fL — AB (ref 78.0–100.0)
PLATELETS: 185 10*3/uL (ref 150–400)
PLATELETS: 183 10*3/uL (ref 150–400)
RBC: 4.24 MIL/uL (ref 4.22–5.81)
RBC: 3.61 MIL/uL — ABNORMAL LOW (ref 4.22–5.81)
RDW: 15.7 % — AB (ref 11.5–15.5)
RDW: 15.2 % (ref 11.5–15.5)
WBC: 10.9 10*3/uL — AB (ref 4.0–10.5)
WBC: 10.1 10*3/uL (ref 4.0–10.5)

## 2017-05-03 LAB — CBC WITH DIFFERENTIAL/PLATELET
BASOS ABS: 0 10*3/uL (ref 0.0–0.1)
Basophils Relative: 0 %
EOS ABS: 0.3 10*3/uL (ref 0.0–0.7)
Eosinophils Relative: 3 %
HCT: 35.7 % — ABNORMAL LOW (ref 39.0–52.0)
HEMOGLOBIN: 11.4 g/dL — AB (ref 13.0–17.0)
LYMPHS PCT: 46 %
Lymphs Abs: 4.3 10*3/uL — ABNORMAL HIGH (ref 0.7–4.0)
MCH: 23.4 pg — ABNORMAL LOW (ref 26.0–34.0)
MCHC: 31.9 g/dL (ref 30.0–36.0)
MCV: 73.3 fL — ABNORMAL LOW (ref 78.0–100.0)
MONO ABS: 0.6 10*3/uL (ref 0.1–1.0)
Monocytes Relative: 6 %
NEUTROS ABS: 4.3 10*3/uL (ref 1.7–7.7)
NEUTROS PCT: 45 %
PLATELETS: 421 10*3/uL — AB (ref 150–400)
RBC: 4.87 MIL/uL (ref 4.22–5.81)
RDW: 14 % (ref 11.5–15.5)
WBC: 9.5 10*3/uL (ref 4.0–10.5)

## 2017-05-03 LAB — COMPREHENSIVE METABOLIC PANEL
ALBUMIN: 2.7 g/dL — AB (ref 3.5–5.0)
ALT: 29 U/L (ref 17–63)
AST: 15 U/L (ref 15–41)
Alkaline Phosphatase: 50 U/L (ref 38–126)
Anion gap: 5 (ref 5–15)
BUN: 13 mg/dL (ref 6–20)
CHLORIDE: 114 mmol/L — AB (ref 101–111)
CO2: 18 mmol/L — AB (ref 22–32)
CREATININE: 0.92 mg/dL (ref 0.61–1.24)
Calcium: 7.2 mg/dL — ABNORMAL LOW (ref 8.9–10.3)
GFR calc Af Amer: >60 mL/min (ref >60)
GLUCOSE: 140 mg/dL — AB (ref 65–99)
POTASSIUM: 5 mmol/L (ref 3.5–5.1)
SODIUM: 137 mmol/L (ref 135–145)
Total Bilirubin: 0.7 mg/dL (ref 0.3–1.2)
Total Protein: 4.7 g/dL — ABNORMAL LOW (ref 6.5–8.1)

## 2017-05-03 LAB — ABO/RH: ABO/RH(D): B POS

## 2017-05-03 LAB — LACTIC ACID, PLASMA
LACTIC ACID, VENOUS: 0.9 mmol/L (ref 0.5–1.9)
LACTIC ACID, VENOUS: 0.8 mmol/L (ref 0.5–1.9)

## 2017-05-03 LAB — POCT I-STAT 3, ART BLOOD GAS (G3+)
Acid-base deficit: 5 mmol/L — ABNORMAL HIGH (ref 0.0–2.0)
Bicarbonate: 19.4 mmol/L — ABNORMAL LOW (ref 20.0–28.0)
O2 Saturation: 100 %
PCO2 ART: 33.1 mmHg (ref 32.0–48.0)
PH ART: 7.374 (ref 7.350–7.450)
TCO2: 20 mmol/L — AB (ref 22–32)
pO2, Arterial: 456 mmHg — ABNORMAL HIGH (ref 83.0–108.0)

## 2017-05-03 LAB — APTT
APTT: 29 s (ref 24–36)
aPTT: 23 seconds — ABNORMAL LOW (ref 24–36)

## 2017-05-03 LAB — I-STAT CG4 LACTIC ACID, ED: LACTIC ACID, VENOUS: 1.51 mmol/L (ref 0.5–1.9)

## 2017-05-03 LAB — PREPARE RBC (CROSSMATCH)

## 2017-05-03 LAB — PROCALCITONIN: Procalcitonin: 0.34 ng/mL

## 2017-05-03 LAB — MAGNESIUM: MAGNESIUM: 1.7 mg/dL (ref 1.7–2.4)

## 2017-05-03 LAB — PHOSPHORUS: Phosphorus: 2.1 mg/dL — ABNORMAL LOW (ref 2.5–4.6)

## 2017-05-03 LAB — TRIGLYCERIDES: Triglycerides: 201 mg/dL — ABNORMAL HIGH (ref <150)

## 2017-05-03 LAB — D-DIMER, QUANTITATIVE (NOT AT ARMC): D DIMER QUANT: 0.35 ug{FEU}/mL (ref 0.00–0.50)

## 2017-05-03 SURGERY — IR WITH ANESTHESIA
Anesthesia: General

## 2017-05-03 MED ORDER — IOPAMIDOL (ISOVUE-300) INJECTION 61%
INTRAVENOUS | Status: AC
  Administered 2017-05-03: 95 mL
  Filled 2017-05-03: qty 100

## 2017-05-03 MED ORDER — LACTATED RINGERS IV SOLN
INTRAVENOUS | Status: DC | PRN
  Administered 2017-05-03: 08:00:00 via INTRAVENOUS

## 2017-05-03 MED ORDER — HEPARIN SODIUM (PORCINE) 1000 UNIT/ML IJ SOLN
INTRAMUSCULAR | Status: DC | PRN
  Administered 2017-05-03 (×3): 1000 [IU] via INTRAVENOUS

## 2017-05-03 MED ORDER — PANTOPRAZOLE SODIUM 40 MG IV SOLR
40.0000 mg | Freq: Every day | INTRAVENOUS | Status: DC
  Administered 2017-05-03 – 2017-05-06 (×4): 40 mg via INTRAVENOUS
  Filled 2017-05-03 (×4): qty 40

## 2017-05-03 MED ORDER — IOPAMIDOL (ISOVUE-300) INJECTION 61%
INTRAVENOUS | Status: AC
  Administered 2017-05-03: 70 mL
  Filled 2017-05-03: qty 300

## 2017-05-03 MED ORDER — SUCCINYLCHOLINE CHLORIDE 20 MG/ML IJ SOLN
INTRAMUSCULAR | Status: DC | PRN
  Administered 2017-05-03: 140 mg via INTRAVENOUS

## 2017-05-03 MED ORDER — LIDOCAINE HCL (CARDIAC) 20 MG/ML IV SOLN
INTRAVENOUS | Status: DC | PRN
Start: 2017-05-03 — End: 2017-05-03
  Administered 2017-05-03: 100 mg via INTRAVENOUS

## 2017-05-03 MED ORDER — ONDANSETRON HCL 4 MG/2ML IJ SOLN
INTRAMUSCULAR | Status: DC | PRN
Start: 2017-05-03 — End: 2017-05-03
  Administered 2017-05-03: 4 mg via INTRAVENOUS

## 2017-05-03 MED ORDER — SODIUM CHLORIDE 0.9 % IV BOLUS (SEPSIS)
1000.0000 mL | Freq: Once | INTRAVENOUS | Status: AC
  Administered 2017-05-03: 1000 mL via INTRAVENOUS

## 2017-05-03 MED ORDER — ROCURONIUM BROMIDE 100 MG/10ML IV SOLN
INTRAVENOUS | Status: DC | PRN
  Administered 2017-05-03 (×2): 50 mg via INTRAVENOUS
  Administered 2017-05-03: 30 mg via INTRAVENOUS

## 2017-05-03 MED ORDER — SODIUM CHLORIDE 0.9 % IV SOLN
250.0000 mL | INTRAVENOUS | Status: DC | PRN
  Administered 2017-05-03: 18:00:00 via INTRAVENOUS
  Administered 2017-05-05 – 2017-05-10 (×5): 250 mL via INTRAVENOUS

## 2017-05-03 MED ORDER — ROCURONIUM BROMIDE 50 MG/5ML IV SOLN
50.0000 mg | Freq: Once | INTRAVENOUS | Status: AC
Start: 2017-05-03 — End: 2017-05-03
  Administered 2017-05-03: 50 mg via INTRAVENOUS
  Filled 2017-05-03: qty 5

## 2017-05-03 MED ORDER — ACETAMINOPHEN 160 MG/5ML PO SOLN: 650.0000 mg | ORAL | Status: DC | PRN

## 2017-05-03 MED ORDER — ONDANSETRON HCL 4 MG/2ML IJ SOLN
4.0000 mg | Freq: Once | INTRAMUSCULAR | Status: AC
  Administered 2017-05-03: 4 mg via INTRAVENOUS
  Filled 2017-05-03: qty 2

## 2017-05-03 MED ORDER — NITROGLYCERIN 1 MG/10 ML FOR IR/CATH LAB
INTRA_ARTERIAL | Status: AC
  Filled 2017-05-03: qty 10

## 2017-05-03 MED ORDER — METOPROLOL TARTRATE 5 MG/5ML IV SOLN
2.5000 mg | INTRAVENOUS | Status: DC | PRN
  Administered 2017-05-03 – 2017-05-08 (×7): 5 mg via INTRAVENOUS
  Filled 2017-05-03 (×7): qty 5

## 2017-05-03 MED ORDER — NICARDIPINE HCL IN NACL 20-0.86 MG/200ML-% IV SOLN: 0.0000 mg/h | INTRAVENOUS | Status: DC

## 2017-05-03 MED ORDER — PROPOFOL 1000 MG/100ML IV EMUL
5.0000 ug/kg/min | INTRAVENOUS | Status: DC
  Administered 2017-05-03: 30 ug/kg/min via INTRAVENOUS
  Filled 2017-05-03 (×2): qty 100

## 2017-05-03 MED ORDER — "THROMBI-PAD 3""X3"" EX PADS"
1.0000 | MEDICATED_PAD | CUTANEOUS | Status: AC
  Filled 2017-05-03: qty 1

## 2017-05-03 MED ORDER — FENTANYL 2500MCG IN NS 250ML (10MCG/ML) PREMIX INFUSION
25.0000 ug/h | INTRAVENOUS | Status: DC
  Administered 2017-05-03 (×2): 400 ug/h via INTRAVENOUS
  Administered 2017-05-03: 18:00:00 via INTRAVENOUS
  Administered 2017-05-04: 25 ug/h via INTRAVENOUS
  Administered 2017-05-04: 300 ug/h via INTRAVENOUS
  Administered 2017-05-04: 100 ug/h via INTRAVENOUS
  Administered 2017-05-04: 300 ug/h via INTRAVENOUS
  Administered 2017-05-05: 250 ug/h via INTRAVENOUS
  Filled 2017-05-03 (×6): qty 250

## 2017-05-03 MED ORDER — LACTATED RINGERS IV SOLN
INTRAVENOUS | Status: DC | PRN
  Administered 2017-05-03: 19:00:00 via INTRAVENOUS

## 2017-05-03 MED ORDER — SODIUM CHLORIDE 0.9 % IV SOLN
INTRAVENOUS | Status: DC | PRN
  Administered 2017-05-03: 09:00:00 via INTRAVENOUS

## 2017-05-03 MED ORDER — ACETAMINOPHEN 650 MG RE SUPP
650.0000 mg | RECTAL | Status: DC | PRN
  Administered 2017-05-07: 650 mg via RECTAL
  Filled 2017-05-03: qty 1

## 2017-05-03 MED ORDER — PROPOFOL 10 MG/ML IV BOLUS
INTRAVENOUS | Status: DC | PRN
  Administered 2017-05-03: 200 mg via INTRAVENOUS

## 2017-05-03 MED ORDER — CEFAZOLIN SODIUM-DEXTROSE 2-4 GM/100ML-% IV SOLN
INTRAVENOUS | Status: AC
  Filled 2017-05-03: qty 100

## 2017-05-03 MED ORDER — ALBUMIN HUMAN 5 % IV SOLN
INTRAVENOUS | Status: DC | PRN
  Administered 2017-05-03 (×2): via INTRAVENOUS

## 2017-05-03 MED ORDER — MIDAZOLAM HCL 5 MG/5ML IJ SOLN
INTRAMUSCULAR | Status: DC | PRN
  Administered 2017-05-03: 2 mg via INTRAVENOUS

## 2017-05-03 MED ORDER — ONDANSETRON HCL 4 MG/2ML IJ SOLN: 4.0000 mg | Freq: Four times a day (QID) | INTRAMUSCULAR | Status: DC | PRN

## 2017-05-03 MED ORDER — ROCURONIUM BROMIDE 50 MG/5ML IV SOLN
50.0000 mg | Freq: Once | INTRAVENOUS | Status: AC
  Filled 2017-05-03: qty 5

## 2017-05-03 MED ORDER — CEFAZOLIN SODIUM-DEXTROSE 2-3 GM-%(50ML) IV SOLR
INTRAVENOUS | Status: DC | PRN
  Administered 2017-05-03: 2 g via INTRAVENOUS

## 2017-05-03 MED ORDER — LACTATED RINGERS IV SOLN
INTRAVENOUS | Status: DC | PRN
  Administered 2017-05-03 (×2): via INTRAVENOUS

## 2017-05-03 MED ORDER — PHENYLEPHRINE HCL 10 MG/ML IJ SOLN
INTRAMUSCULAR | Status: DC | PRN
  Administered 2017-05-03: 160 ug via INTRAVENOUS
  Administered 2017-05-03: 80 ug via INTRAVENOUS
  Administered 2017-05-03: 160 ug via INTRAVENOUS
  Administered 2017-05-03: 80 ug via INTRAVENOUS
  Administered 2017-05-03: 120 ug via INTRAVENOUS

## 2017-05-03 MED ORDER — ACETAMINOPHEN 650 MG RE SUPP: 650.0000 mg | RECTAL | Status: DC | PRN

## 2017-05-03 MED ORDER — FENTANYL CITRATE (PF) 100 MCG/2ML IJ SOLN
INTRAMUSCULAR | Status: AC
  Administered 2017-05-03: 100 ug
  Filled 2017-05-03: qty 4

## 2017-05-03 MED ORDER — PHENYLEPHRINE HCL 10 MG/ML IJ SOLN
INTRAMUSCULAR | Status: DC | PRN
  Administered 2017-05-03: 10:00:00 via INTRAVENOUS
  Administered 2017-05-03: 50 ug/min via INTRAVENOUS

## 2017-05-03 MED ORDER — CALCIUM CHLORIDE 10 % IV SOLN
INTRAVENOUS | Status: DC | PRN
  Administered 2017-05-03 (×2): 100 mg via INTRAVENOUS
  Administered 2017-05-03: 200 mg via INTRAVENOUS

## 2017-05-03 MED ORDER — SODIUM CHLORIDE 0.9 % IV SOLN
INTRAVENOUS | Status: DC
  Administered 2017-05-03 – 2017-05-05 (×2): via INTRAVENOUS

## 2017-05-03 MED ORDER — SODIUM CHLORIDE 0.9 % IV SOLN
1.0000 g | Freq: Once | INTRAVENOUS | Status: AC
  Administered 2017-05-03: 1 g via INTRAVENOUS
  Filled 2017-05-03: qty 10

## 2017-05-03 MED ORDER — PANTOPRAZOLE SODIUM 40 MG IV SOLR: 40.0000 mg | Freq: Every day | INTRAVENOUS | Status: DC

## 2017-05-03 MED ORDER — ROCURONIUM BROMIDE 100 MG/10ML IV SOLN
INTRAVENOUS | Status: DC | PRN
  Administered 2017-05-03 (×2): 100 mg via INTRAVENOUS

## 2017-05-03 MED ORDER — IOPAMIDOL (ISOVUE-300) INJECTION 61%
INTRAVENOUS | Status: AC
  Filled 2017-05-03: qty 300

## 2017-05-03 MED ORDER — FENTANYL CITRATE (PF) 100 MCG/2ML IJ SOLN
INTRAMUSCULAR | Status: AC
  Administered 2017-05-03: 100 ug
  Filled 2017-05-03: qty 2

## 2017-05-03 MED ORDER — FENTANYL BOLUS VIA INFUSION
50.0000 ug | INTRAVENOUS | Status: DC | PRN
  Administered 2017-05-05: 50 ug via INTRAVENOUS
  Filled 2017-05-03: qty 50

## 2017-05-03 MED ORDER — ACETAMINOPHEN 325 MG PO TABS: 650.0000 mg | ORAL_TABLET | ORAL | Status: DC | PRN

## 2017-05-03 MED ORDER — DEXAMETHASONE SODIUM PHOSPHATE 10 MG/ML IJ SOLN
INTRAMUSCULAR | Status: DC | PRN
  Administered 2017-05-03: 10 mg via INTRAVENOUS

## 2017-05-03 MED ORDER — PROPOFOL 1000 MG/100ML IV EMUL
5.0000 ug/kg/min | INTRAVENOUS | Status: DC
  Administered 2017-05-03 (×3): 80 ug/kg/min via INTRAVENOUS
  Administered 2017-05-03: 50 ug/kg/min via INTRAVENOUS
  Administered 2017-05-04: 25 ug/kg/min via INTRAVENOUS
  Administered 2017-05-04: 50 ug/kg/min via INTRAVENOUS
  Administered 2017-05-04: 60 ug/kg/min via INTRAVENOUS
  Administered 2017-05-04: 70 ug/kg/min via INTRAVENOUS
  Administered 2017-05-04: 20 ug/kg/min via INTRAVENOUS
  Administered 2017-05-05: 40 ug/kg/min via INTRAVENOUS
  Administered 2017-05-05: 50 ug/kg/min via INTRAVENOUS
  Filled 2017-05-03 (×10): qty 100

## 2017-05-03 MED ORDER — IOPAMIDOL (ISOVUE-370) INJECTION 76%
INTRAVENOUS | Status: AC
  Administered 2017-05-03: 50 mL
  Filled 2017-05-03: qty 50

## 2017-05-03 MED ORDER — CEFAZOLIN SODIUM-DEXTROSE 2-4 GM/100ML-% IV SOLN
INTRAVENOUS | Status: AC
Start: 2017-05-03 — End: 2017-05-03
  Filled 2017-05-03: qty 100

## 2017-05-03 MED ORDER — FENTANYL CITRATE (PF) 100 MCG/2ML IJ SOLN
INTRAMUSCULAR | Status: DC | PRN
  Administered 2017-05-03: 100 ug via INTRAVENOUS

## 2017-05-03 MED ORDER — FENTANYL CITRATE (PF) 100 MCG/2ML IJ SOLN
50.0000 ug | Freq: Once | INTRAMUSCULAR | Status: AC
  Administered 2017-05-03: 50 ug via INTRAVENOUS

## 2017-05-03 MED ORDER — PROPOFOL 500 MG/50ML IV EMUL
INTRAVENOUS | Status: DC | PRN
  Administered 2017-05-03: 12:00:00 via INTRAVENOUS
  Administered 2017-05-03: 50 ug/kg/min via INTRAVENOUS

## 2017-05-03 MED ORDER — HYDRALAZINE HCL 20 MG/ML IJ SOLN
10.0000 mg | INTRAMUSCULAR | Status: DC | PRN
  Administered 2017-05-03 – 2017-05-05 (×4): 20 mg via INTRAVENOUS
  Administered 2017-05-05 – 2017-05-07 (×3): 40 mg via INTRAVENOUS
  Filled 2017-05-03: qty 1
  Filled 2017-05-03 (×3): qty 2
  Filled 2017-05-03 (×2): qty 1
  Filled 2017-05-03 (×2): qty 2

## 2017-05-03 MED ORDER — SODIUM CHLORIDE 0.9 % IV BOLUS (SEPSIS)
1000.0000 mL | Freq: Once | INTRAVENOUS | Status: AC
  Administered 2017-05-03 (×2): 1000 mL via INTRAVENOUS

## 2017-05-03 MED ORDER — SODIUM CHLORIDE 0.9 % IV SOLN
Freq: Once | INTRAVENOUS | Status: AC
  Administered 2017-05-03: 13:00:00 via INTRAVENOUS

## 2017-05-03 MED ORDER — IOPAMIDOL (ISOVUE-300) INJECTION 61%
INTRAVENOUS | Status: AC
Start: 2017-05-03 — End: 2017-05-03
  Administered 2017-05-03: 100 mL
  Filled 2017-05-03: qty 300

## 2017-05-03 NOTE — Procedures (Signed)
S/P 4 vessel cerebral arteriogram,followed by superselective embolization of Lt  Lingual artery,medial Lt facial artery branches ,Lt ascending pharyngeal  Artery and Lt lingual artery with PVA particles of 300 to 500 and 500 to 700 microns.

## 2017-05-03 NOTE — ED Provider Notes (Signed)
[facility] EMERGENCY DEPARTMENT Provider Note   CSN: [id number] Arrival date & time: 05/03/17  0447     History   Chief Complaint Chief Complaint  Patient presents with  . Facial Laceration    HPI Joel Mathis is a 36 y.o. male.  The history is provided by the patient.  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  He had been doing well until tonight when he woke up with bleeding from his mouth and vomiting blood.  He denies any trauma.  He denies any aspirin use.  He is not on any anticoagulants or antiplatelet agents.  He is a former smoker having stopped following his surgery.  He denies ethanol use.  No past medical history on file.  There are no active problems to display for this patient.   No past surgical history on file.     Home Medications    Prior to Admission medications   Medication Sig Start Date End Date Taking? Authorizing Provider  amoxicillin (AMOXIL) 500 MG capsule Take 1 capsule (500 mg total) by mouth 3 (three) times daily. 03/15/16   Ashley Murrain, NP  naproxen (NAPROSYN) 500 MG tablet Take 1 tablet (500 mg total) by mouth 2 (two) times daily. 03/15/16   Ashley Murrain, NP  naproxen sodium (ANAPROX) 220 MG tablet Take 220 mg by mouth 2 (two) times daily as needed (for pain).    [provider]    Family History No family history on file.  Social History Social History   Tobacco Use  . Smoking status: Current Every Day Smoker    Types: Cigarettes  . Smokeless tobacco: Never Used  Substance Use Topics  . Alcohol use: Yes  . Drug use: No     Allergies   Patient has no known allergies.   Review of Systems Review of Systems  All other systems reviewed and are negative.    Physical Exam Updated Vital Signs BP (!) 106/96 (BP Location: Left Arm)   Pulse (!) 125   Temp 97.6 F (36.4 C) (Axillary)   Resp 16   Ht 6' (1.829 m)   Wt 97.5 kg (215  lb)   SpO2 100%   BMI 29.16 kg/m   Physical Exam  Nursing note and vitals reviewed.  36 year old male, resting comfortably and in no acute distress. Vital signs are significant for tachycardia. Oxygen saturation is 100%, which is normal. Head is normocephalic and atraumatic. PERRLA, EOMI. there is both bright red and dark red blood present in the oral cavity.  On suctioning the blood, surgical site is noted on the left side of the base of the tongue without active bleeding.  However, shortly after the exam, patient vomited a large amount of blood and he was now having some bright red blood in the mouth.  He was unable to cooperate for me to get a good visualization of the surgical site. Neck is nontender and supple without adenopathy or JVD. Back is nontender and there is no CVA tenderness. Lungs are clear without rales, wheezes, or rhonchi. Chest is nontender. Heart is tachycardic without murmur. Abdomen is soft, flat, nontender without masses or hepatosplenomegaly and peristalsis is hypoactive. Extremities have no cyanosis or edema, full range of motion is present. Skin is warm and dry without rash. Neurologic: Mental status is normal, cranial nerves are intact, there are no motor or sensory deficits.  ED Treatments / Results  Labs (all labs ordered are listed, but only abnormal results are displayed) Labs Reviewed  BASIC METABOLIC PANEL - Abnormal; Notable for the following components:      Result Value   Glucose, Bld 140 (*)    Creatinine, Ser 1.48 (*)    GFR calc non Af Amer 59 (*)    All other components within normal limits  CBC WITH DIFFERENTIAL/PLATELET - Abnormal; Notable for the following components:   Hemoglobin 11.4 (*)    HCT 35.7 (*)    MCV 73.3 (*)    MCH 23.4 (*)    Platelets 421 (*)    Lymphs Abs 4.3 (*)    All other components within normal limits  I-STAT CG4 LACTIC ACID, ED  I-STAT CG4 LACTIC ACID, ED  TYPE AND SCREEN  ABO/RH    EKG  EKG  Interpretation  Date/Time:  Friday May 03 2017 05:00:14 EST Ventricular Rate:  174 PR Interval:    QRS Duration: 84 QT Interval:  274 QTC Calculation: 467 R Axis:   44 Text Interpretation:  Sinus tachycardia RSR' in V1 or V2, right VCD or RVH No old tracing to compare Confirmed by Delora Fuel ([id number]) on 05/03/2017 5:09:07 AM       Radiology Ct Angio Neck W And/or Wo Contrast  Result Date: 05/03/2017 CLINICAL DATA:  Surgery for a tongue mass 2 weeks ago.  Hematemesis. EXAM: CT ANGIOGRAPHY NECK TECHNIQUE: Multidetector CT imaging of the neck was performed using the standard protocol during bolus administration of intravenous contrast. Multiplanar CT image reconstructions and MIPs were obtained to evaluate the vascular anatomy. Carotid stenosis measurements (when applicable) are obtained utilizing NASCET criteria, using the distal internal carotid diameter as the denominator. CONTRAST:  84mL ISOVUE-370 IOPAMIDOL (ISOVUE-370) INJECTION 76% COMPARISON:  Soft tissue neck CT 01/04/2014 FINDINGS: Aortic arch: Standard 3 vessel aortic arch. Brachiocephalic and subclavian arteries are widely patent. Right carotid system: Patent without evidence of stenosis, dissection, or aneurysm. Left carotid system: Patent without evidence of stenosis, dissection, or aneurysm. Vertebral arteries: Patent and codominant without evidence of stenosis or dissection. Skeleton: No acute osseous abnormality or suspicious osseous lesion. Other neck: There is a postoperative defect in the left tongue base consistent with history of recent tumor resection. There is some attenuation of the left lingual artery in the operative region, however no active extravasation or pseudoaneurysm is identified. There is some asymmetric soft tissue fullness/swelling laterally in the left oropharynx. This examination was not tailored to assess the soft tissues, and correlation with any more recent outside preoperative imaging is suggested. A 9  mm short axis right level IIa lymph node is unchanged from 2015. Upper chest: Partially visualized patchy ground-glass opacities in the posterior right upper lobe. IMPRESSION: 1. Postoperative changes from recent tongue base mass resection. No active arterial extravasation or pseudoaneurysm identified in this region. 2. Partially visualized pulmonary ground-glass opacities in the right upper lobe which could represent infection or hemorrhage. Electronically Signed   By: Logan Bores M.D.   On: 05/03/2017 07:20    Procedures Procedures (including critical care time) CRITICAL CARE Performed by: Delora Fuel Total critical care time: 95 minutes Critical care time was exclusive of separately billable procedures and treating other patients. Critical care was necessary to treat or prevent imminent or life-threatening deterioration. Critical care was time spent personally by me on the following activities: development of treatment plan with patient and/or surrogate as well as nursing, discussions with consultants, evaluation of patient's response to  treatment, examination of patient, obtaining history from patient or surrogate, ordering and performing treatments and interventions, ordering and review of laboratory studies, ordering and review of radiographic studies, pulse oximetry and re-evaluation of patient's condition.  Medications Ordered in ED Medications  sodium chloride 0.9 % bolus 1,000 mL (not administered)  ondansetron (ZOFRAN) injection 4 mg (4 mg Intravenous Given 05/03/17 0544)  sodium chloride 0.9 % bolus 1,000 mL (1,000 mLs Intravenous New Bag/Given 05/03/17 0605)  iopamidol (ISOVUE-370) 76 % injection (50 mLs  Contrast Given 05/03/17 0615)     Initial Impression / Assessment and Plan / ED Course  I have reviewed the triage vital signs and the nursing notes.  Pertinent labs & imaging results that were available during my care of the patient were reviewed by me and considered in my  medical decision making (see chart for details).  Postoperative bleeding following resection of tumor on the tongue.  I am unable to obtain his records from his surgery at [facility].  However, ENT consultation will be required.  In the meantime, he will be given IV fluids.  Case has been discussed with Dr. Janace Hoard of ENT who states that consultation will be needed with interventional radiology.  Case is discussed with Dr. Earleen Newport of interventional radiology who request CT angiogram be obtained.  This is done and CT angiogram shows no obvious active bleeding.  Dr. Carman Ching and Dr. Hoyt Koch are in seeing the patient and recommend formal angiography.  I have also been on the phone with the resident from [facility] who states they will discussed the case with the attending ENT physician, Dr. [place]-Cascade Callas.  At this point, he is still significantly tachycardic.  There is been no bleeding for approximately 1 hour, but he is not felt to be stable for transport to [facility] until the formal angiography is completed.  He is being given additional fluid resuscitation.  Case is discussed with Dr. Jeneen Rinks who is oncoming ED physician who will arrange additional coordination if necessary.  Final Clinical Impressions(s) / ED Diagnoses   Final diagnoses:  Oral bleeding  Anemia due to acute blood loss  Renal insufficiency    ED Discharge Orders    None       Delora Fuel, MD 92/33/00 [phone]

## 2017-05-03 NOTE — Anesthesia Procedure Notes (Signed)
Arterial Line Insertion Start/End12/11/2016 8:41 AM Performed by: White, Amedeo Plenty, CRNA, CRNA  Lidocaine 1% used for infiltration Left, radial was placed Catheter size: 20 G Maximum sterile barriers used  and Seldinger technique used Allen's test indicative of satisfactory collateral circulation Attempts: 1 Procedure performed without using ultrasound guided technique. Following insertion, Biopatch and dressing applied. Post procedure assessment: normal  Patient tolerated the procedure well with no immediate complications. Additional procedure comments: Placed by Dicie Beam, SRNA.

## 2017-05-03 NOTE — Sedation Documentation (Signed)
Report given to [facility]

## 2017-05-03 NOTE — Anesthesia Preprocedure Evaluation (Signed)
Anesthesia Evaluation  Patient identified by MRN, date of birth, ID band Patient awake    Reviewed: Allergy & Precautions, NPO status , Patient's Chart, lab work & pertinent test results  Airway Mallampati: III      Comment: Active bleeding in the airway Dental  (+) Poor Dentition   Pulmonary former smoker,    Pulmonary exam normal        Cardiovascular negative cardio ROS   Rhythm:Regular Rate:Tachycardia     Neuro/Psych negative neurological ROS  negative psych ROS   GI/Hepatic negative GI ROS, Neg liver ROS,   Endo/Other    Renal/GU negative Renal ROS  negative genitourinary   Musculoskeletal negative musculoskeletal ROS (+)   Abdominal Normal abdominal exam  (+)   Peds  Hematology  (+) Blood dyscrasia, anemia , Active bleeding.   Anesthesia Other Findings   Reproductive/Obstetrics                             Anesthesia Physical Anesthesia Plan  ASA: III and emergent  Anesthesia Plan: General   Post-op Pain Management:    Induction: Intravenous  PONV Risk Score and Plan: Ondansetron and Treatment may vary due to age or medical condition  Airway Management Planned: Oral ETT and Video Laryngoscope Planned  Additional Equipment:   Intra-op Plan:   Post-operative Plan: Post-operative intubation/ventilation  Informed Consent: I have reviewed the patients History and Physical, chart, labs and discussed the procedure including the risks, benefits and alternatives for the proposed anesthesia with the patient or authorized representative who has indicated his/her understanding and acceptance.   Dental advisory given  Plan Discussed with: CRNA and Surgeon  Anesthesia Plan Comments:         Anesthesia Quick Evaluation

## 2017-05-03 NOTE — ED Notes (Signed)
Transported to IR 

## 2017-05-03 NOTE — ED Notes (Signed)
MD aware of VS. Patient is starting to feel; better

## 2017-05-03 NOTE — Anesthesia Preprocedure Evaluation (Signed)
Anesthesia Evaluation  Patient identified by MRN, date of birth, ID band Patient awake    Reviewed: Allergy & Precautions, NPO status , Patient's Chart, lab work & pertinent test results  Airway Mallampati: Intubated  TM Distance: >3 FB    Comment: Active bleeding in the airway Dental  (+) Poor Dentition   Pulmonary former smoker,    Pulmonary exam normal        Cardiovascular negative cardio ROS   Rhythm:Regular Rate:Tachycardia     Neuro/Psych negative neurological ROS  negative psych ROS   GI/Hepatic negative GI ROS, Neg liver ROS,   Endo/Other    Renal/GU negative Renal ROS  negative genitourinary   Musculoskeletal negative musculoskeletal ROS (+)   Abdominal Normal abdominal exam  (+)   Peds  Hematology  (+) Blood dyscrasia, anemia , Active bleeding.   Anesthesia Other Findings   Reproductive/Obstetrics                             Anesthesia Physical  Anesthesia Plan  ASA: IV  Anesthesia Plan: General   Post-op Pain Management:    Induction: Inhalational  PONV Risk Score and Plan: 3 and Ondansetron and Treatment may vary due to age or medical condition  Airway Management Planned: Oral ETT  Additional Equipment: Arterial line and CVP  Intra-op Plan:   Post-operative Plan: Post-operative intubation/ventilation  Informed Consent: I have reviewed the patients History and Physical, chart, labs and discussed the procedure including the risks, benefits and alternatives for the proposed anesthesia with the patient or authorized representative who has indicated his/her understanding and acceptance.   Dental advisory given  Plan Discussed with: CRNA and Surgeon  Anesthesia Plan Comments:         Anesthesia Quick Evaluation

## 2017-05-03 NOTE — Progress Notes (Signed)
[facility]  Called back emergently for bright red blood bleeding from his mouth that started after arrival to the ICU. Amount of blood could lost not be quantified, at least 300-400 cc estimate.  BP elevated to systolic 932.  Improved with propofol  After BP improved, bleeding seems to have stopped.  Called ENT on call, Dr. Wilburn Cornelia who stated that the only approach is repeat embolization. Discussed with family who said that the patient had his procedure at [facility] two weeks ago, they couldn't recall the name of the physician.  He has not received radiation that they are aware of.  Discussed with Dr. Estanislado Pandy.  Plan: Place large bore catheter now for rapid infusion of 2 U PRBC, 2 U FFP, 1 U Plt now Will also give calcium Send stat CBC/PT/INR  Family updated  Additional cc time 40 minutes  Roselie Awkward, MD [place] [facility] Pager: [phone] Cell: [phone] After 3pm or if no response, call [phone]

## 2017-05-03 NOTE — ED Triage Notes (Signed)
Patient presents to ed via [facility] states he had surgery on his tongue 2 weeks ago  For a cancerous mass of which he had removed and is to follow up with his PCP in [facility] 12/12. States he is to start Chemo /Radiation 1/7. States he hasn't had any problems until tonight was fine when he went to bed , woke up feeling sick on his stomach and started vomiting blood. States he kept swallowing the blood because he was afraid he was going to lose to much blood. Actively bleeding from mouth upon arrival . MD aware

## 2017-05-03 NOTE — H&P (Signed)
PULMONARY / CRITICAL CARE MEDICINE   Name: Joel Mathis MRN: [id number] DOB: 1980-10-05    ADMISSION DATE:  05/03/2017   REFERRING MD: Emergency department physician  CHIEF COMPLAINT: Lingual bleeding  HISTORY OF PRESENT ILLNESS:   [name] who while in prison had left ear pain.  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  He presented to the intensive care unit post interventional radiology intervention for embolization of lingual bleeding.  Subsequently had other lingual bleeding copious amounts.  His blood pressure was artificially lowered to keep him around systolic blood pressure 629 to decrease bleeding.  Interventional radiology has reconsulted and is at the bedside.  PAST MEDICAL HISTORY :  He  has a past medical history of Tongue cancer ([place]).  PAST SURGICAL HISTORY: He  has a past surgical history that includes Tongue surgery.  No Known Allergies  No current facility-administered medications on file prior to encounter.    Current Outpatient Medications on File Prior to Encounter  Medication Sig  . acetaminophen (TYLENOL) 500 MG tablet Take 1,000 mg by mouth every 6 (six) hours as needed for mild pain.  . naproxen sodium (ANAPROX) 220 MG tablet Take 220 mg by mouth 2 (two) times daily as needed (for pain).    FAMILY HISTORY:  His has no family status information on file.    SOCIAL HISTORY: He  reports that he has quit smoking. His smoking use included cigarettes. he has never used smokeless tobacco. He reports that he drinks alcohol. He reports that he does not use drugs.  REVIEW OF SYSTEMS:   Not available  SUBJECTIVE:  36 year old male who had interventional radiology embolization of the lingual bleeding on 05/03/2017.  Return to the ICU and had a new episode of bleeding.  Not available  VITAL SIGNS: BP (!) 86/62   Pulse (!) 128   Temp 97.6 F (36.4 C) (Axillary)    Resp (!) 25   Ht 6' (1.829 m)   Wt 215 lb (97.5 kg)   SpO2 100%   BMI 29.16 kg/m   HEMODYNAMICS:    VENTILATOR SETTINGS:    INTAKE / OUTPUT: No intake/output data recorded.  PHYSICAL EXAMINATION: General: Well-nourished well-developed African-American male currently on full ventilatory support Neuro: Heavily sedated HEENT: Copious lingual bleeding, endotracheal tube in place. Cardiovascular: Heart sounds are regular regular rate and rhythm  Lungs: Clear to auscultation Abdomen: Soft nontender Musculoskeletal: Intact Skin:  Warm and dry, right right femoral arterial site dry dressing dry and intact left femoral trauma cath in place.  LABS:  BMET Recent Labs  Lab 04/29/17 2042 05/03/17 0505  NA 139 136  K 4.4 4.2  CL 100* 104  CO2  --  23  BUN 17 18  CREATININE 1.20 1.48*  GLUCOSE 113* 140*    Electrolytes Recent Labs  Lab 05/03/17 0505  CALCIUM 8.9    CBC Recent Labs  Lab 04/29/17 2018 04/29/17 2042 05/03/17 0505  WBC 9.2  --  9.5  HGB 13.8 16.7 11.4*  HCT 43.1 49.0 35.7*  PLT 347  --  421*    Coag's Recent Labs  Lab 05/03/17 0839  APTT 23*  INR 1.22    Sepsis Markers Recent Labs  Lab 04/29/17 2042 05/03/17 0812  LATICACIDVEN 1.15 1.51    ABG No results for input(s): PHART, PCO2ART, PO2ART in the last 168 hours.  Liver Enzymes No results for input(s): AST, ALT, ALKPHOS, BILITOT,  ALBUMIN in the last 168 hours.  Cardiac Enzymes No results for input(s): TROPONINI, PROBNP in the last 168 hours.  Glucose No results for input(s): GLUCAP in the last 168 hours.  Imaging Ct Angio Neck W And/or Wo Contrast  Result Date: 05/03/2017 CLINICAL DATA:  Surgery for a tongue mass 2 weeks ago.  Hematemesis. EXAM: CT ANGIOGRAPHY NECK TECHNIQUE: Multidetector CT imaging of the neck was performed using the standard protocol during bolus administration of intravenous contrast. Multiplanar CT image reconstructions and MIPs were obtained to evaluate  the vascular anatomy. Carotid stenosis measurements (when applicable) are obtained utilizing NASCET criteria, using the distal internal carotid diameter as the denominator. CONTRAST:  21mL ISOVUE-370 IOPAMIDOL (ISOVUE-370) INJECTION 76% COMPARISON:  Soft tissue neck CT 01/04/2014 FINDINGS: Aortic arch: Standard 3 vessel aortic arch. Brachiocephalic and subclavian arteries are widely patent. Right carotid system: Patent without evidence of stenosis, dissection, or aneurysm. Left carotid system: Patent without evidence of stenosis, dissection, or aneurysm. Vertebral arteries: Patent and codominant without evidence of stenosis or dissection. Skeleton: No acute osseous abnormality or suspicious osseous lesion. Other neck: There is a postoperative defect in the left tongue base consistent with history of recent tumor resection. There is some attenuation of the left lingual artery in the operative region, however no active extravasation or pseudoaneurysm is identified. There is some asymmetric soft tissue fullness/swelling laterally in the left oropharynx. This examination was not tailored to assess the soft tissues, and correlation with any more recent outside preoperative imaging is suggested. A 9 mm short axis right level IIa lymph node is unchanged from 2015. Upper chest: Partially visualized patchy ground-glass opacities in the posterior right upper lobe. IMPRESSION: 1. Postoperative changes from recent tongue base mass resection. No active arterial extravasation or pseudoaneurysm identified in this region. 2. Partially visualized pulmonary ground-glass opacities in the right upper lobe which could represent infection or hemorrhage. Electronically Signed   By: Logan Bores M.D.   On: 05/03/2017 07:20     STUDIES:    CULTURES:   ANTIBIOTICS:   SIGNIFICANT EVENTS: 05/03/2017 intubated for interventional radiology procedure of embolization of lingual bleed.  His airway was difficult secondary to copious  bleeding 05/03/2017 after coming to the intensive care unit becoming hypertensive.  Blood pressure 170 he had copious lingual arterial bleeding.  He was sedated pressure was applied trauma cath was inserted interventional radiology has been contacted and he is to return for further interventions.  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  He noted left ear pain while he was in prison. He presented to the [facility] with copious lingual bleeding.  He was taken to interventional radiology by Dr. Matthew Saras and had embolization of the lingual artery.  He remains intubated at this time is noted to have a mass in the airway.  But not a difficult intubation according to the CRNA who intubated him.  Major complication was copious blood in the airway.  05/03/2017 endotracheal tube  ASSESSMENT / PLAN:  PULMONARY A: Vent dependent respiratory failure secondary to lingual bleeding in the setting of recent squamous cell resection of left lingual area. According anesthesia his airway did show evidence of a mass.  But difficulty of intubation secondary to bloody view rather than anatomy of the airway.  P:   Wean as tolerated from mechanical ventilatory support as tolerated Vent bundle Noted has had further bleeding issues since coming  to the intensive care unit therefore no extubation is planned and in in the near future.    CARDIOVASCULAR A:  Currently on Neo-Synephrine drip most likely from propofol sedation. No known history of cardiovascular disease in a 36 year old. P:  We will add antihypertensives to keep his systolic blood pressure in the 100 range. Noted to increase oral lingual bleeding since return to the intensive care unit.  Fresh frozen plasma and blood were ordered. Our goal of keeping systolic blood pressure less than 100.7 this bleeding  episodes. He is to go back to interventional radiology for further interventions.  RENAL Lab Results  Component Value Date   CREATININE 1.48 (H) 05/03/2017   CREATININE 1.20 04/29/2017   CREATININE 1.40 (H) 01/04/2014   Recent Labs  Lab 04/29/17 2042 05/03/17 0505  K 4.4 4.2     A:   Mild renal insufficiency P:   Hydration as needed Follow creatinine  GASTROINTESTINAL A:   Currently n.p.o. Questionable ability to eat with recent head neck cancer resection P:   N.p.o. for now  HEMATOLOGIC Recent Labs    05/03/17 0505  HGB 11.4*   Lab Results  Component Value Date   INR 1.22 05/03/2017     A:   Current lingual bleeding post interventional radiology  embolization P:  Monitor hemoglobin Monitor coags in the settings of new squamous cell cancer  INFECTIOUS A:   Recent left lingual resection approximately 2 weeks ago at [facility] for squamous cell carcinoma. No overt signs and symptoms of infection at this time P:   Monitor white count temperature and other indicators of infection.  ENDOCRINE A:   No acute issues P:   Monitor glucose on labs  NEUROLOGIC A:   Intubated and sedated currently on propofol drip P:   RASS goal: -1 Plan to wean sedation as tolerated depending on whether extubation will be undertaken anytime soon.   FAMILY  - Updates:   - Inter-disciplinary family meet or Palliative Care meeting due by:  day 7    Steve July Linam ACNP [facility] P[facility] Pager [phone] till 1 pm If no answer page [phone] 05/03/2017, 11:42 AM

## 2017-05-03 NOTE — Transfer of Care (Signed)
Immediate Anesthesia Transfer of Care Note  Patient: Joel Mathis  Procedure(s) Performed: IR WITH ANESTHESIA (N/A )  Patient Location: ICU  Anesthesia Type:General  Level of Consciousness: sedated and Patient remains intubated per anesthesia plan  Airway & Oxygen Therapy: Patient remains intubated per anesthesia plan and Patient placed on Ventilator (see vital sign flow sheet for setting)  Post-op Assessment: Report given to RN and Post -op Vital signs reviewed and stable  Post vital signs: Reviewed and stable  Last Vitals:  Vitals:   05/03/17 1700 05/03/17 2101  BP:    Pulse: 84   Resp: 15   Temp:    SpO2: 100% 100%    Last Pain:  Vitals:   05/03/17 1448  TempSrc: Axillary         Complications: No apparent anesthesia complications

## 2017-05-03 NOTE — Progress Notes (Signed)
Called by bedside RN patient remains very hypertensive on multiple drips.  Hemorrhage remains.  Chart reviewed.  Patient examined, sedate and lungs are clear.  Hemorrhage noted.  FFP ordered, platelets ordered, central line inserted, pRBC ordered.  Lopressor for BP control to decrease bleeding.  Labs followed and addressed.  Will follow behind Dr Lake Bells.  The patient is critically ill with multiple organ systems failure and requires high complexity decision making for assessment and support, frequent evaluation and titration of therapies, application of advanced monitoring technologies and extensive interpretation of multiple databases.   Critical Care Time devoted to patient care services described in this note is  45  Minutes. This time reflects time of care of this signee Dr Jennet Maduro. This critical care time does not reflect procedure time, or teaching time or supervisory time of PA/NP/Med student/Med Resident etc but could involve care discussion time.  Rush Farmer, M.D. [facility] Pulmonary/Critical Care Medicine. Pager: [phone]. After hours pager: [phone].

## 2017-05-03 NOTE — Anesthesia Procedure Notes (Addendum)
Procedure Name: Intubation Date/Time: 05/03/2017 8:17 AM Performed by: White, Amedeo Plenty, CRNA Pre-anesthesia Checklist: Patient identified, Emergency Drugs available, Suction available and Patient being monitored Patient Re-evaluated:Patient Re-evaluated prior to induction Oxygen Delivery Method: Circle System Utilized Preoxygenation: Pre-oxygenation with 100% oxygen Induction Type: IV induction, Rapid sequence and Cricoid Pressure applied Ventilation: Mask ventilation without difficulty Laryngoscope Size: Mac and 4 Grade View: Grade IV Tube type: Oral Tube size: 7.5 mm Number of attempts: 2 Airway Equipment and Method: Stylet and Oral airway Placement Confirmation: ETT inserted through vocal cords under direct vision,  positive ETCO2 and breath sounds checked- equal and bilateral Secured at: 23 cm Tube secured with: Tape Dental Injury: Teeth and Oropharynx as per pre-operative assessment  Comments: Patient presented with severe tongue bleed. Patient had been vomiting in ED prior with bloody stools. RSI with cricoid pressure. VLx1 with McGrath 4 by SRNA, copious blood in airway despite suctioning, grade IV view due to blood. DLx2 by CRNA, grade IV view due to blood despite suctioning, blind intubation by CRNA. BBS=, +ETCO2. ETT secured.

## 2017-05-03 NOTE — Anesthesia Postprocedure Evaluation (Signed)
Anesthesia Post Note  Patient: Joel Mathis  Procedure(s) Performed: IR WITH ANESTHESIA, angio of embolism (N/A )     Patient location during evaluation: ICU Anesthesia Type: General Level of consciousness: patient remains intubated per anesthesia plan Pain management: pain level controlled Vital Signs Assessment: post-procedure vital signs reviewed and stable Respiratory status: patient remains intubated per anesthesia plan and patient on ventilator - see flowsheet for VS Cardiovascular status: stable Postop Assessment: no apparent nausea or vomiting Anesthetic complications: no    Last Vitals:  Vitals:   05/03/17 0615 05/03/17 0630  BP: 105/70 (!) 86/62  Pulse: (!) 120 (!) 128  Resp: 19 (!) 25  Temp:    SpO2: 100% 100%    Last Pain:  Vitals:   05/03/17 0513  TempSrc: Axillary   Pain Goal:                 Joel Mathis,Joel Mathis

## 2017-05-03 NOTE — Transfer of Care (Signed)
Immediate Anesthesia Transfer of Care Note  Patient: DARRAGH NAY  Procedure(s) Performed: IR WITH ANESTHESIA, angio of embolism (N/A )  Patient Location: ICU  Anesthesia Type:General  Level of Consciousness: Patient remains intubated per anesthesia plan  Airway & Oxygen Therapy: Patient remains intubated per anesthesia plan and Patient placed on Ventilator (see vital sign flow sheet for setting)  Post-op Assessment: Report given to RN and Post -op Vital signs reviewed and stable  Post vital signs: Reviewed and stable  Last Vitals:  Vitals:   05/03/17 0615 05/03/17 0630  BP: 105/70 (!) 86/62  Pulse: (!) 120 (!) 128  Resp: 19 (!) 25  Temp:    SpO2: 100% 100%    Last Pain:  Vitals:   05/03/17 0513  TempSrc: Axillary         Complications: No apparent anesthesia complications

## 2017-05-03 NOTE — Procedures (Signed)
Central Venous Catheter Insertion Procedure Note Joel Mathis [id number] 1980/06/22  Procedure: Insertion of Central Venous Catheter Indications: Drug and/or fluid administration  Procedure Details Consent: Unable to obtain consent because of emergent medical necessity. Time Out: Verified patient identification, verified procedure, site/side was marked, verified correct patient position, special equipment/implants available, medications/allergies/relevent history reviewed, required imaging and test results available.  Performed  Maximum sterile technique was used including antiseptics, cap, gloves, gown, hand hygiene, mask and sheet. Skin prep: Chlorhexidine; local anesthetic administered A antimicrobial bonded/coated triple lumen catheter was placed in the left femoral vein due to emergent situation using the Seldinger technique.  Evaluation Blood flow good Complications: No apparent complications Patient did tolerate procedure well. Chest X-ray ordered to verify placement.  CXR: N/A.  Joel Mathis 05/03/2017, 2:56 PM

## 2017-05-03 NOTE — ED Provider Notes (Signed)
Patient taken to IR with Dr. Garen Grams. Will stay intubated, and is being transferred to the ICU tonight. I had discussed the case with ENT service, via the resident, at [facility]. The patient's attending there is Dr. Colin Benton. He can be reached at [phone]   Tanna Furry, MD 05/03/17 1028

## 2017-05-03 NOTE — Progress Notes (Signed)
   05/03/17 1300  Clinical Encounter Type  Visited With Family  Visit Type Spiritual support  Referral From Nurse  Consult/Referral To Chaplain  Spiritual Encounters  Spiritual Needs Prayer  Stress Factors  Family Stress Factors Health changes;Exhausted  Chaplain visited with the mother and grand mother of the PT.  Family was very distraught because PT is bleeding out in the room.  As doctors are searching for answers the family turned to prayer.  Chaplain provided spiritual support to the family as they prayed and cried.  The mother of the PT needed to exhale and chaplain provided the comfortable space for the family to do so.

## 2017-05-03 NOTE — Anesthesia Postprocedure Evaluation (Signed)
Anesthesia Post Note  Patient: Joel Mathis  Procedure(s) Performed: IR WITH ANESTHESIA (N/A )     Anesthesia Post Evaluation  Last Vitals:  Vitals:   05/03/17 1700 05/03/17 2101  BP:    Pulse: 84   Resp: 15   Temp:    SpO2: 100% 100%    Last Pain:  Vitals:   05/03/17 1448  TempSrc: Axillary                 Ramla Hase

## 2017-05-03 NOTE — ED Notes (Signed)
No bleeding at present . Family at bedside.

## 2017-05-03 NOTE — Procedures (Signed)
S?p superselective embolization of facial artery branches,IMAX arteries ,Lt ,sup thyroidal artery inframandibular arteries and transverse facial branches bilaterally with 250 to 300 micron PVA particles,300 to 500 micron PVA particles and 500 to 700 micron PVA particles .

## 2017-05-03 NOTE — Consult Note (Signed)
Chief Complaint: I am bleeding  Referring Physician(s): Dr. Roxanne Mins  Supervising Physician: Corrie Mckusick  Patient Status: [facility] - ED  History of Present Illness: Joel Mathis is a 36 y.o. male presenting to the ED at [facility] with acute bleeding from his mouth, and 3 episodes of bloody emesis.    Interview conducted with his mother and girlfriend in the room, who provide some history.   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  He tells me that he has not yet received the path diagnosis, and has his follow up 12/12 at [facility].   The diagnosis was made while he was in prison, as he was having left ear pain, and then he was referred to [facility].   He lives in [place], and has no primary care physician.    He spent a few days in the [place] after his surgery, then went home.  He denies any bleeding before today/last night.  He denies any nausea at this time.  He denies any pain.    He is currently comfortable supine on his ED stretcher.  No bleeding at this time.    He tells me he has not yet started any chemotherapy or radiation, but at least chemotherapy is planned for January.   Currently he is hemodynamically normal.    Past Medical History:  Diagnosis Date  . Tongue cancer [facility])     Past Surgical History:  Procedure Laterality Date  . TONGUE SURGERY      Allergies: Patient has no known allergies.  Medications: Prior to Admission medications   Medication Sig Start Date End Date Taking? Authorizing Provider  amoxicillin (AMOXIL) 500 MG capsule Take 1 capsule (500 mg total) by mouth 3 (three) times daily. 03/15/16   Ashley Murrain, NP  naproxen (NAPROSYN) 500 MG tablet Take 1 tablet (500 mg total) by mouth 2 (two) times daily. 03/15/16   Ashley Murrain, NP  naproxen sodium (ANAPROX) 220 MG tablet Take 220 mg by mouth 2 (two) times daily as needed (for pain).    [provider]     No family history on file.  Social History   Socioeconomic History  . Marital status: Single    Spouse name: None  . Number of children: None  . Years of education: None  . Highest education level: None  Social Needs  . Financial resource strain: None  . Food insecurity - worry: None  . Food insecurity - inability: None  . Transportation needs - medical: None  . Transportation needs - non-medical: None  Occupational History  . None  Tobacco Use  . Smoking status: Former Smoker    Types: Cigarettes  . Smokeless tobacco: Never Used  Substance and Sexual Activity  . Alcohol use: Yes  . Drug use: No  . Sexual activity: None  Other Topics Concern  . None  Social History Narrative  . None    ECOG Status: 0 - Asymptomatic  Review of Systems: A 12 point ROS discussed and pertinent positives are indicated in the HPI above.  All other systems are negative.  Review of Systems  Vital Signs: BP (!) 86/62   Pulse (!) 128   Temp 97.6 F (36.4 C) (Axillary)   Resp (!) 25   Ht 6' (1.829 m)   Wt 215 lb (97.5 kg)   SpO2 100%   BMI  29.16 kg/m   Physical Exam General: 36 yo male appearing stated age.  Well-developed, well-nourished.  No distress. HEENT: Atraumatic, normocephalic.  Conjugate gaze, extra-ocular motor intact. Muffled voice. Small blood at the mouth. No active bleeding.  No scleral icterus or scleral injection. No lesions on external ears, nose, lips, or gums.     Neck: Symmetric with no goiter enlargement.  Chest/Lungs:  Symmetric chest with inspiration/expiration.  No labored breathing.   Marland Kitchen  Heart:  RRR,   No JVD appreciated.  Abdomen:  Soft, NT/ND, with + bowel sounds.   Genito-urinary: Deferred Neurologic: Alert & Oriented to person, place, and time.   Normal affect and insight.  Appropriate questions.  Moving all 4 extremities with gross sensory intact.  Pulse Exam:  Palpable pedal pulses bilaterally.   Imaging: No results  found.  Labs:  CBC: Recent Labs    04/29/17 2018 04/29/17 2042 05/03/17 0505  WBC 9.2  --  9.5  HGB 13.8 16.7 11.4*  HCT 43.1 49.0 35.7*  PLT 347  --  421*    COAGS: No results for input(s): INR, APTT in the last 8760 hours.  BMP: Recent Labs    04/29/17 2042 05/03/17 0505  NA 139 136  K 4.4 4.2  CL 100* 104  CO2  --  23  GLUCOSE 113* 140*  BUN 17 18  CALCIUM  --  8.9  CREATININE 1.20 1.48*  GFRNONAA  --  59*  GFRAA  --  >60    LIVER FUNCTION TESTS: No results for input(s): BILITOT, AST, ALT, ALKPHOS, PROT, ALBUMIN in the last 8760 hours.  TUMOR MARKERS: No results for input(s): AFPTM, CEA, CA199, CHROMGRNA in the last 8760 hours.  Assessment and Plan:  36 yo male presenting with acute oropharyngeal hemorrhage, 2 weeks SP left tongue base H&N tumor resection at [facility].    Diagnosis is post-operative venous bleeding vs arterial bleeding, or carotid blow-out (type 3).   My impression is that cerebral angiogram with possible embolization is indicated.   I have discussed with them the risk benefit profile of the procedure, risks including: bleeding, infection, arterial injury/dissection, contrast reaction, kidney injury, need for further surgery/procedure, non-target embolization, ~1% risk of stroke, cardiopulmonary collapse, death.   They are not yet committed to the procedure at the end of our discussion.  They would like to discuss as a family and let the team know.   Thank you for this interesting consult.  I greatly enjoyed meeting MUSSA GROESBECK and look forward to participating in their care.  A copy of this report was sent to the requesting provider on this date.  Electronically Signed: Corrie Mckusick, DO 05/03/2017, 7:02 AM   I spent a total of 55 Miinutes    in face to face in clinical consultation, greater than 50% of which was counseling/coordinating care for head and neck tumor with hemorrhage, carotid blowout, possible angiogram/embolization.

## 2017-05-03 NOTE — Progress Notes (Signed)
Patient ID: Joel Mathis, male   DOB: [birth year], 36 y.o.   MRN: [id number] INR. Patient continues to bleed intermittently from his mouth despite earlier embolization.. Hemodynamically stabilized.Marland Kitchen ENT consult discussion noted per  CCM note.Marland KitchenOption of more aggressive embolization reviewed.. Will proceed with embolization of bilateral ECA  branches. Discussed with patients family.They are in agreement with the procedure.. Informed witnessed consent obtained. S.Jenayah Antu MD

## 2017-05-04 ENCOUNTER — Other Ambulatory Visit: Payer: Self-pay

## 2017-05-04 ENCOUNTER — Inpatient Hospital Stay (HOSPITAL_COMMUNITY): Payer: Medicaid Other

## 2017-05-04 DIAGNOSIS — J9601 Acute respiratory failure with hypoxia: Secondary | ICD-10-CM

## 2017-05-04 LAB — MRSA PCR SCREENING: MRSA BY PCR: NEGATIVE

## 2017-05-04 LAB — CBC WITH DIFFERENTIAL/PLATELET
Basophils Absolute: 0 10*3/uL (ref 0.0–0.1)
Basophils Relative: 0 %
Eosinophils Absolute: 0 10*3/uL (ref 0.0–0.7)
Eosinophils Relative: 0 %
HEMATOCRIT: 28.3 % — AB (ref 39.0–52.0)
HEMOGLOBIN: 9.6 g/dL — AB (ref 13.0–17.0)
LYMPHS ABS: 1.5 10*3/uL (ref 0.7–4.0)
Lymphocytes Relative: 13 %
MCH: 26.2 pg (ref 26.0–34.0)
MCHC: 33.9 g/dL (ref 30.0–36.0)
MCV: 77.1 fL — AB (ref 78.0–100.0)
MONOS PCT: 6 %
Monocytes Absolute: 0.7 10*3/uL (ref 0.1–1.0)
NEUTROS ABS: 9.3 10*3/uL — AB (ref 1.7–7.7)
NEUTROS PCT: 81 %
Platelets: 184 10*3/uL (ref 150–400)
RBC: 3.67 MIL/uL — ABNORMAL LOW (ref 4.22–5.81)
RDW: 15.4 % (ref 11.5–15.5)
WBC: 11.4 10*3/uL — ABNORMAL HIGH (ref 4.0–10.5)

## 2017-05-04 LAB — POCT I-STAT 3, ART BLOOD GAS (G3+)
ACID-BASE DEFICIT: 6 mmol/L — AB (ref 0.0–2.0)
Bicarbonate: 18.7 mmol/L — ABNORMAL LOW (ref 20.0–28.0)
O2 SAT: 100 %
PO2 ART: 175 mmHg — AB (ref 83.0–108.0)
Patient temperature: 99.3
TCO2: 20 mmol/L — ABNORMAL LOW (ref 22–32)
pCO2 arterial: 34.2 mmHg (ref 32.0–48.0)
pH, Arterial: 7.347 — ABNORMAL LOW (ref 7.350–7.450)

## 2017-05-04 LAB — PREPARE PLATELET PHERESIS: Unit division: 0

## 2017-05-04 LAB — BPAM PLATELET PHERESIS
Blood Product Expiration Date: 201812082359
ISSUE DATE / TIME: 201812071158
Unit Type and Rh: 6200

## 2017-05-04 LAB — BPAM FFP
BLOOD PRODUCT EXPIRATION DATE: 201812122359
ISSUE DATE / TIME: 201812071052
Unit Type and Rh: 7300

## 2017-05-04 LAB — BASIC METABOLIC PANEL
ANION GAP: 6 (ref 5–15)
BUN: 9 mg/dL (ref 6–20)
CHLORIDE: 109 mmol/L (ref 101–111)
CO2: 20 mmol/L — AB (ref 22–32)
Calcium: 7.6 mg/dL — ABNORMAL LOW (ref 8.9–10.3)
Creatinine, Ser: 0.93 mg/dL (ref 0.61–1.24)
GFR calc non Af Amer: >60 mL/min (ref >60)
GLUCOSE: 99 mg/dL (ref 65–99)
Potassium: 3.5 mmol/L (ref 3.5–5.1)
Sodium: 135 mmol/L (ref 135–145)

## 2017-05-04 LAB — HIV ANTIBODY (ROUTINE TESTING W REFLEX): HIV Screen 4th Generation wRfx: NONREACTIVE

## 2017-05-04 LAB — PREPARE FRESH FROZEN PLASMA: UNIT DIVISION: 0

## 2017-05-04 LAB — GLUCOSE, CAPILLARY: Glucose-Capillary: 89 mg/dL (ref 65–99)

## 2017-05-04 LAB — PHOSPHORUS: PHOSPHORUS: 2 mg/dL — AB (ref 2.5–4.6)

## 2017-05-04 LAB — MAGNESIUM: Magnesium: 1.7 mg/dL (ref 1.7–2.4)

## 2017-05-04 LAB — TRIGLYCERIDES: Triglycerides: 353 mg/dL — ABNORMAL HIGH (ref <150)

## 2017-05-04 MED ORDER — ORAL CARE MOUTH RINSE
15.0000 mL | Freq: Four times a day (QID) | OROMUCOSAL | Status: DC
  Administered 2017-05-04 (×2): 15 mL via OROMUCOSAL

## 2017-05-04 MED ORDER — MAGNESIUM SULFATE 2 GM/50ML IV SOLN
2.0000 g | Freq: Once | INTRAVENOUS | Status: AC
  Administered 2017-05-04: 2 g via INTRAVENOUS
  Filled 2017-05-04: qty 50

## 2017-05-04 MED ORDER — CHLORHEXIDINE GLUCONATE 0.12 % MT SOLN
15.0000 mL | Freq: Two times a day (BID) | OROMUCOSAL | Status: DC
  Administered 2017-05-04 – 2017-05-11 (×15): 15 mL via OROMUCOSAL
  Filled 2017-05-04 (×4): qty 15

## 2017-05-04 MED ORDER — WHITE PETROLATUM EX OINT
TOPICAL_OINTMENT | CUTANEOUS | Status: DC | PRN
  Administered 2017-05-04: 1 via TOPICAL
  Administered 2017-05-05: 21:00:00 via TOPICAL
  Administered 2017-05-05: 1 via TOPICAL
  Administered 2017-05-07: 16:00:00 via TOPICAL
  Filled 2017-05-04 (×2): qty 28.35

## 2017-05-04 MED ORDER — POTASSIUM PHOSPHATES 15 MMOLE/5ML IV SOLN
30.0000 mmol | Freq: Once | INTRAVENOUS | Status: AC
  Administered 2017-05-04: 30 mmol via INTRAVENOUS
  Filled 2017-05-04: qty 10

## 2017-05-04 MED ORDER — ORAL CARE MOUTH RINSE
15.0000 mL | OROMUCOSAL | Status: DC
  Administered 2017-05-04 – 2017-05-05 (×9): 15 mL via OROMUCOSAL

## 2017-05-04 MED ORDER — INFLUENZA VAC SPLIT QUAD 0.5 ML IM SUSY
0.5000 mL | PREFILLED_SYRINGE | INTRAMUSCULAR | Status: AC
  Administered 2017-05-05: 0.5 mL via INTRAMUSCULAR
  Filled 2017-05-04: qty 0.5

## 2017-05-04 NOTE — Progress Notes (Signed)
Pt is restless but appropriate. Not pulling at lines or interfering with care. Restraints discontinued, will continue to monitor.

## 2017-05-04 NOTE — Progress Notes (Signed)
Referring Physician(s): Dr. Nelda Marseille  Supervising Physician: Luanne Bras  Patient Status:  [facility] - In-pt  Chief Complaint: Lingual bleeding  Subjective: Remains intubated.  No active bleeding.  Alert on vent.   Allergies: Patient has no known allergies.  Medications: Prior to Admission medications   Medication Sig Start Date End Date Taking? Authorizing Provider  acetaminophen (TYLENOL) 500 MG tablet Take 1,000 mg by mouth every 6 (six) hours as needed for mild pain.   Yes [provider]  naproxen sodium (ANAPROX) 220 MG tablet Take 220 mg by mouth 2 (two) times daily as needed (for pain).   Yes [provider]     Vital Signs: BP 120/66   Pulse 70   Temp 98.9 F (37.2 C) (Axillary)   Resp 14   Ht 6\' 5"  (1.956 m)   Wt 225 lb 1.4 oz (102.1 kg)   SpO2 100%   BMI 26.69 kg/m   Physical Exam  Alert on vent Head/Neck:  No active bleeding.  Gums intact. ETT in place.  Groin: soft, tender, no evidence of hematoma or pseudoaneurysm.  Imaging: Ct Angio Neck W And/or Wo Contrast  Result Date: 05/03/2017 CLINICAL DATA:  Surgery for a tongue mass 2 weeks ago.  Hematemesis. EXAM: CT ANGIOGRAPHY NECK TECHNIQUE: Multidetector CT imaging of the neck was performed using the standard protocol during bolus administration of intravenous contrast. Multiplanar CT image reconstructions and MIPs were obtained to evaluate the vascular anatomy. Carotid stenosis measurements (when applicable) are obtained utilizing NASCET criteria, using the distal internal carotid diameter as the denominator. CONTRAST:  25mL ISOVUE-370 IOPAMIDOL (ISOVUE-370) INJECTION 76% COMPARISON:  Soft tissue neck CT 01/04/2014 FINDINGS: Aortic arch: Standard 3 vessel aortic arch. Brachiocephalic and subclavian arteries are widely patent. Right carotid system: Patent without evidence of stenosis, dissection, or aneurysm. Left carotid system: Patent without evidence of stenosis, dissection, or  aneurysm. Vertebral arteries: Patent and codominant without evidence of stenosis or dissection. Skeleton: No acute osseous abnormality or suspicious osseous lesion. Other neck: There is a postoperative defect in the left tongue base consistent with history of recent tumor resection. There is some attenuation of the left lingual artery in the operative region, however no active extravasation or pseudoaneurysm is identified. There is some asymmetric soft tissue fullness/swelling laterally in the left oropharynx. This examination was not tailored to assess the soft tissues, and correlation with any more recent outside preoperative imaging is suggested. A 9 mm short axis right level IIa lymph node is unchanged from 2015. Upper chest: Partially visualized patchy ground-glass opacities in the posterior right upper lobe. IMPRESSION: 1. Postoperative changes from recent tongue base mass resection. No active arterial extravasation or pseudoaneurysm identified in this region. 2. Partially visualized pulmonary ground-glass opacities in the right upper lobe which could represent infection or hemorrhage. Electronically Signed   By: Logan Bores M.D.   On: 05/03/2017 07:20   Dg Chest Port 1 View  Result Date: 05/04/2017 CLINICAL DATA:  36 year old male with respiratory failure EXAM: PORTABLE CHEST 1 VIEW COMPARISON:  Prior chest x-ray 05/03/2017 FINDINGS: Patient remains intubated. The tip of the endotracheal tube is 5.3 cm above the carina. Low lung volumes with mild bibasilar atelectasis. The lungs are otherwise clear. Cardiac mediastinal contours remain unchanged. No acute osseous abnormality. IMPRESSION: 1. Stable position of endotracheal tube. 2. Persistent low lung volumes with bibasilar atelectasis. Electronically Signed   By: Jacqulynn Cadet M.D.   On: 05/04/2017 08:57   Dg Chest [facility] 1 View  Result  Date: 05/03/2017 CLINICAL DATA:  Central line placement. EXAM: PORTABLE CHEST 1 VIEW COMPARISON:  None. FINDINGS:  Endotracheal tube in place with the tip approximately 2.9 cm above the level of the carina. No central venous catheter visualized. The cardiomediastinal silhouette is normal in size. Normal pulmonary vascularity. Low lung volumes. No focal consolidation, pleural effusion, or pneumothorax. No acute osseous abnormality. IMPRESSION: 1. No central line visualized. Please note the indication for the study is central line placement. 2. Endotracheal tube in appropriate position. No active cardiopulmonary disease. Electronically Signed   By: Titus Dubin M.D.   On: 05/03/2017 13:42    Labs:  CBC: Recent Labs    05/03/17 0505 05/03/17 1344 05/03/17 2250 05/04/17 0409  WBC 9.5 10.9* 10.1 11.4*  HGB 11.4* 11.0* 9.4* 9.6*  HCT 35.7* 33.6* 27.8* 28.3*  PLT 421* 185 183 184    COAGS: Recent Labs    05/03/17 0839 05/03/17 1344 05/03/17 2250  INR 1.22 1.16 1.15  APTT 23* 29  --     BMP: Recent Labs    04/29/17 2042 05/03/17 0505 05/03/17 1344 05/04/17 0409  NA 139 136 137 135  K 4.4 4.2 5.0 3.5  CL 100* 104 114* 109  CO2  --  23 18* 20*  GLUCOSE 113* 140* 140* 99  BUN 17 18 13 9   CALCIUM  --  8.9 7.2* 7.6*  CREATININE 1.20 1.48* 0.92 0.93  GFRNONAA  --  59* >60 >60  GFRAA  --  >60 >60 >60    LIVER FUNCTION TESTS: Recent Labs    05/03/17 1344  BILITOT 0.7  AST 15  ALT 29  ALKPHOS 50  PROT 4.7*  ALBUMIN 2.7*    Assessment and Plan: Lingual bleeding s/p embolization x2 Patient had continued bleeding from his mouth and underwent second embolization procedure overnight.  Currently stable.  HgB 9.6 Remains intubated.  IR to follow.   Electronically Signed: Docia Barrier, PA 05/04/2017, 11:13 AM   I spent a total of 15 Minutes at the the patient's bedside AND on the patient's hospital floor or unit, greater than 50% of which was counseling/coordinating care for lingual bleeding

## 2017-05-04 NOTE — Progress Notes (Signed)
Initial Nutrition Assessment  DOCUMENTATION CODES:  Not applicable  INTERVENTION:  If unable to extubate, recommend TF:   TF via OGT vs NGT with Vital AF 1.2 at goal rate of 80 ml/h (1920 ml per day) to provide 2304 kcals, 144 gm protein, 1557 ml free water daily.  Question if patient will be able to safely have oral intake after extubation. If requires tube, would need placed by IR.   NUTRITION DIAGNOSIS:  Inadequate oral intake related to inability to eat as evidenced by NPO status.  GOAL:  Patient will meet greater than or equal to 90% of their needs  MONITOR:  Diet advancement, Vent status, Labs, Weight trends  REASON FOR ASSESSMENT:  Ventilator    ASSESSMENT:  36 y.o male who developed ear pain while incarcerated and was dx with SCC of L base of L tongue s/p resection approximately 2 weeks ago. Presented with profuse acute bleeding from mouth w/ bloody emesis. Underwent superselective embolization in am of 12/7. Had recurrent bleeding and underwent repeat embolization in pm of 12/7. Remains intubated post-procedure.   Per MD note this AM, plan to start TF tomorrow morning if remains intubated. There is concern over his ability to have sufficient/safe oral intake w/ recent H&N cancer resection. Anesthesia did note mass in airway.   On RD arrival, pt intubated, but is awake and alert. He is on low dose propofol. No family in room. RN reports likely stopping all sedation and attempting extubation this am. Will not account for propofol given plan for titrating the propofol off.   If patient is not safe to eat, would likely require PEG vs short term NGT depending on treatment plan for cancer. Given location of bleeding, he would a high risk placement of bedside cortrak and would likely need to be done in IR.   TF recommendations will be left in case unable to extubate  Per chart, he has gained a large amount of weight over past couple years.   Physical Exam: Well  nourished  Patient is currently intubated on ventilator support MV: 10.5 L/min Temp (24hrs), Avg:98.4 F (36.9 C), Min:97.8 F (36.6 C), Max:99.3 F (37.4 C) Propofol: 5.9 ml/hr  Labs: TG: 353, WBC:11.4, Phos: 2.0, H/H:9.6/28.3 Meds: ppi, IV abx, propofol, iv fent, K phos,   Recent Labs  Lab 05/03/17 0505 05/03/17 1344 05/04/17 0409  NA 136 137 135  K 4.2 5.0 3.5  CL 104 114* 109  CO2 23 18* 20*  BUN 18 13 9   CREATININE 1.48* 0.92 0.93  CALCIUM 8.9 7.2* 7.6*  MG  --  1.7 1.7  PHOS  --  2.1* 2.0*  GLUCOSE 140* 140* 99   NUTRITION - FOCUSED PHYSICAL EXAM: WDL, Well nourished  Diet Order:  Diet NPO time specified  EDUCATION NEEDS:  No education needs have been identified at this time  Skin:  Skin Assessment: Reviewed RN Assessment  Last BM:  Unknown  Height:  Ht Readings from Last 1 Encounters:  05/03/17 6\' 5"  (1.956 m)   Weight:  Wt Readings from Last 1 Encounters:  05/04/17 225 lb 1.4 oz (102.1 kg)   Wt Readings from Last 10 Encounters:  05/04/17 225 lb 1.4 oz (102.1 kg)  04/29/17 215 lb (97.5 kg)  03/15/16 164 lb (74.4 kg)  01/16/16 165 lb (74.8 kg)  01/27/14 175 lb (79.4 kg)   Ideal Body Weight:  94.55 kg  BMI:  Body mass index is 26.69 kg/m.  Estimated Nutritional Needs:  Kcal:  0623 (PSU  2003 B) Protein:  143-163g Pro (1.4-1.6 g/kg bw) Fluid:  Per MD  Burtis Junes RD, LDN, CNSC Clinical Nutrition Pager: [phone] 05/04/2017 11:48 AM

## 2017-05-04 NOTE — H&P (Signed)
PULMONARY / CRITICAL CARE MEDICINE   Name: Joel Mathis MRN: [id number] DOB: February 17, 1981    ADMISSION DATE:  05/03/2017   REFERRING MD: Emergency department physician  CHIEF COMPLAINT: Lingual bleeding  HISTORY OF PRESENT ILLNESS:   [name] who while in prison had left ear pain.  He was subsequently went to [facility] and had resection of left lingual area for squamous cell carcinoma.  He presented to the intensive care unit post interventional radiology intervention for embolization of lingual bleeding.  Subsequently had other lingual bleeding copious amounts.  His blood pressure was artificially lowered to keep him around systolic blood pressure 867 to decrease bleeding.  Interventional radiology has reconsulted and is at the bedside.  SUBJECTIVE:  No events overnight, bleeding under control, no new complaints  VITAL SIGNS: BP 120/66   Pulse 70   Temp 98.9 F (37.2 C) (Axillary)   Resp 14   Ht 6\' 5"  (1.956 m)   Wt 102.1 kg (225 lb 1.4 oz)   SpO2 100%   BMI 26.69 kg/m    HEMODYNAMICS:    VENTILATOR SETTINGS: Vent Mode: PRVC FiO2 (%):  [40 %-100 %] 40 % Set Rate:  [14 bmp] 14 bmp Vt Set:  [600 mL-700 mL] 700 mL PEEP:  [5 cmH20] 5 cmH20 Plateau Pressure:  [18 cmH20-24 cmH20] 24 cmH20  INTAKE / OUTPUT: I/O last 3 completed shifts: In: 74 [I.V.:4595.2; Blood:4781.9; IV Piggyback:610] Out: 6195 [Urine:2675; Blood:650]  PHYSICAL EXAMINATION: General: Acutely ill appearing male, NAD Neuro: Awake and interactive, moving all ext to command HEENT: [place]/AT, PERRL, EOM-I and MMM, tongue with minimal bleeding at this point Cardiovascular: RRR, Nl S1/S2, -M/R/G Lungs: CTA bilaterally Abdomen: Soft, NT, ND and +BS Musculoskeletal: -edema and -tenderness Skin: Intact  LABS:  BMET Recent Labs  Lab 05/03/17 0505 05/03/17 1344 05/04/17 0409  NA 136 137 135  K 4.2 5.0 3.5  CL 104 114* 109  CO2 23 18* 20*  BUN 18 13 9   CREATININE 1.48*  0.92 0.93  GLUCOSE 140* 140* 99    Electrolytes Recent Labs  Lab 05/03/17 0505 05/03/17 1344 05/04/17 0409  CALCIUM 8.9 7.2* 7.6*  MG  --  1.7 1.7  PHOS  --  2.1* 2.0*   CBC Recent Labs  Lab 05/03/17 1344 05/03/17 2250 05/04/17 0409  WBC 10.9* 10.1 11.4*  HGB 11.0* 9.4* 9.6*  HCT 33.6* 27.8* 28.3*  PLT 185 183 184   Coag's Recent Labs  Lab 05/03/17 0839 05/03/17 1344 05/03/17 2250  APTT 23* 29  --   INR 1.22 1.16 1.15   Sepsis Markers Recent Labs  Lab 05/03/17 0812 05/03/17 1344 05/03/17 2328  LATICACIDVEN 1.51 0.9 0.8  PROCALCITON  --  0.34  --    ABG Recent Labs  Lab 05/03/17 1350 05/04/17 0409  PHART 7.374 7.347*  PCO2ART 33.1 34.2  PO2ART 456.0* 175.0*   Liver Enzymes Recent Labs  Lab 05/03/17 1344  AST 15  ALT 29  ALKPHOS 50  BILITOT 0.7  ALBUMIN 2.7*   Cardiac Enzymes No results for input(s): TROPONINI, PROBNP in the last 168 hours.  Glucose Recent Labs  Lab 05/03/17 1618 05/03/17 2351 05/04/17 0335  GLUCAP 101* 96 89   Imaging Dg Chest Port 1 View  Result Date: 05/04/2017 CLINICAL DATA:  36 year old male with respiratory failure EXAM: PORTABLE CHEST 1 VIEW COMPARISON:  Prior chest x-ray 05/03/2017 FINDINGS: Patient remains intubated. The tip of the endotracheal tube is 5.3 cm above the carina. Low lung  volumes with mild bibasilar atelectasis. The lungs are otherwise clear. Cardiac mediastinal contours remain unchanged. No acute osseous abnormality. IMPRESSION: 1. Stable position of endotracheal tube. 2. Persistent low lung volumes with bibasilar atelectasis. Electronically Signed   By: Jacqulynn Cadet M.D.   On: 05/04/2017 08:57   Dg Chest Port 1 View  Result Date: 05/03/2017 CLINICAL DATA:  Central line placement. EXAM: PORTABLE CHEST 1 VIEW COMPARISON:  None. FINDINGS: Endotracheal tube in place with the tip approximately 2.9 cm above the level of the carina. No central venous catheter visualized. The cardiomediastinal  silhouette is normal in size. Normal pulmonary vascularity. Low lung volumes. No focal consolidation, pleural effusion, or pneumothorax. No acute osseous abnormality. IMPRESSION: 1. No central line visualized. Please note the indication for the study is central line placement. 2. Endotracheal tube in appropriate position. No active cardiopulmonary disease. Electronically Signed   By: Titus Dubin M.D.   On: 05/03/2017 13:42   STUDIES:    CULTURES:   ANTIBIOTICS:   SIGNIFICANT EVENTS: 05/03/2017 intubated for interventional radiology procedure of embolization of lingual bleed.  His airway was difficult secondary to copious bleeding 05/03/2017 after coming to the intensive care unit becoming hypertensive.  Blood pressure 170 he had copious lingual arterial bleeding.  He was sedated pressure was applied trauma cath was inserted interventional radiology has been contacted and he is to return for further interventions.  LINES/TUBES: 05/03/2017 endotracheal tube>>  DISCUSSION: 36 year old male who had resection of left squamous cell lingual cancer at [facility] approximately 2 weeks ago.  He noted left ear pain while he was in prison. He presented to the [facility] with copious lingual bleeding.  He was taken to interventional radiology by Dr. Matthew Saras and had embolization of the lingual artery.  He remains intubated at this time is noted to have a mass in the airway.  But not a difficult intubation according to the CRNA who intubated him.  Major complication was copious blood in the airway.  05/03/2017 endotracheal tube  ASSESSMENT / PLAN:  PULMONARY A: Vent dependent respiratory failure secondary to lingual bleeding in the setting of recent squamous cell resection of left lingual area. According anesthesia his airway did show evidence of a mass.  But difficulty of intubation secondary to bloody view rather than anatomy of the airway.  P:   Begin PS trials but no extubation if  tongue continues to be an issue Vent bundle Titrate O2 for sat of 88-92%  CARDIOVASCULAR A:  Currently on Neo-Synephrine drip most likely from propofol sedation. No known history of cardiovascular disease in a 36 year old. P:  Anti-HTN as ordered Tele monitoring Minimize sedation as able  RENAL Lab Results  Component Value Date   CREATININE 0.93 05/04/2017   CREATININE 0.92 05/03/2017   CREATININE 1.48 (H) 05/03/2017   Recent Labs  Lab 05/03/17 0505 05/03/17 1344 05/04/17 0409  K 4.2 5.0 3.5   A:   Mild renal insufficiency P:   BMET in AM Replace electrolytes as indicated Mg, K and Phos to be replaced today NS 75 ml/hr  GASTROINTESTINAL A:   Currently n.p.o. Questionable ability to eat with recent head neck cancer resection P:   TF in AM if remains intubated  HEMATOLOGIC Recent Labs    05/03/17 2250 05/04/17 0409  HGB 9.4* 9.6*   Lab Results  Component Value Date   INR 1.15 05/03/2017   INR 1.16 05/03/2017   INR 1.22 05/03/2017   A:   Current lingual bleeding post  interventional radiology  embolization P:  Monitor hemoglobin Monitor coags in the settings of new squamous cell cancer  INFECTIOUS A:   Recent left lingual resection approximately 2 weeks ago at [facility] for squamous cell carcinoma. No overt signs and symptoms of infection at this time P:   Monitor white count temperature and other indicators of infection. Hold off abx for now  ENDOCRINE A:   No acute issues P:   Monitor glucose on labs  NEUROLOGIC A:   Intubated and sedated currently on propofol drip P:   RASS goal: -1 Propofol and fentanyl drips, will attempt to wean off today  FAMILY  - Updates: No family bedside to update  - Inter-disciplinary family meet or Palliative Care meeting due by:  day 7  The patient is critically ill with multiple organ systems failure and requires high complexity decision making for assessment and support, frequent evaluation and  titration of therapies, application of advanced monitoring technologies and extensive interpretation of multiple databases.   Critical Care Time devoted to patient care services described in this note is  35  Minutes. This time reflects time of care of this signee Dr Jennet Maduro. This critical care time does not reflect procedure time, or teaching time or supervisory time of PA/NP/Med student/Med Resident etc but could involve care discussion time.  Rush Farmer, M.D. [facility] Pulmonary/Critical Care Medicine. Pager: [phone]. After hours pager: [phone].  05/04/2017, 9:03 AM

## 2017-05-05 ENCOUNTER — Inpatient Hospital Stay (HOSPITAL_COMMUNITY): Payer: Medicaid Other

## 2017-05-05 DIAGNOSIS — C4492 Squamous cell carcinoma of skin, unspecified: Secondary | ICD-10-CM

## 2017-05-05 LAB — URINALYSIS, ROUTINE W REFLEX MICROSCOPIC
Bilirubin Urine: NEGATIVE
GLUCOSE, UA: NEGATIVE mg/dL
Ketones, ur: 20 mg/dL — AB
Leukocytes, UA: NEGATIVE
NITRITE: NEGATIVE
Protein, ur: NEGATIVE mg/dL
SPECIFIC GRAVITY, URINE: 1.015 (ref 1.005–1.030)
Squamous Epithelial / LPF: NONE SEEN
pH: 5 (ref 5.0–8.0)

## 2017-05-05 LAB — BLOOD GAS, ARTERIAL
Acid-base deficit: 3.8 mmol/L — ABNORMAL HIGH (ref 0.0–2.0)
Bicarbonate: 20.3 mmol/L (ref 20.0–28.0)
Drawn by: 362771
FIO2: 0.4
LHR: 15 {breaths}/min
MECHVT: 630 mL
O2 Saturation: 98.9 %
PATIENT TEMPERATURE: 100
PEEP: 5 cmH2O
PO2 ART: 160 mmHg — AB (ref 83.0–108.0)
pCO2 arterial: 35.2 mmHg (ref 32.0–48.0)
pH, Arterial: 7.383 (ref 7.350–7.450)

## 2017-05-05 LAB — CBC
HEMATOCRIT: 29 % — AB (ref 39.0–52.0)
HEMOGLOBIN: 9.5 g/dL — AB (ref 13.0–17.0)
MCH: 25.8 pg — ABNORMAL LOW (ref 26.0–34.0)
MCHC: 32.8 g/dL (ref 30.0–36.0)
MCV: 78.8 fL (ref 78.0–100.0)
Platelets: 211 10*3/uL (ref 150–400)
RBC: 3.68 MIL/uL — ABNORMAL LOW (ref 4.22–5.81)
RDW: 15.7 % — ABNORMAL HIGH (ref 11.5–15.5)
WBC: 15.3 10*3/uL — AB (ref 4.0–10.5)

## 2017-05-05 LAB — BASIC METABOLIC PANEL
ANION GAP: 7 (ref 5–15)
BUN: 6 mg/dL (ref 6–20)
CHLORIDE: 107 mmol/L (ref 101–111)
CO2: 20 mmol/L — ABNORMAL LOW (ref 22–32)
Calcium: 7.9 mg/dL — ABNORMAL LOW (ref 8.9–10.3)
Creatinine, Ser: 0.89 mg/dL (ref 0.61–1.24)
GFR calc Af Amer: >60 mL/min (ref >60)
GFR calc non Af Amer: >60 mL/min (ref >60)
Glucose, Bld: 109 mg/dL — ABNORMAL HIGH (ref 65–99)
POTASSIUM: 3.3 mmol/L — AB (ref 3.5–5.1)
SODIUM: 134 mmol/L — AB (ref 135–145)

## 2017-05-05 LAB — PROCALCITONIN: Procalcitonin: 0.18 ng/mL

## 2017-05-05 LAB — MAGNESIUM: MAGNESIUM: 1.9 mg/dL (ref 1.7–2.4)

## 2017-05-05 LAB — GLUCOSE, CAPILLARY: GLUCOSE-CAPILLARY: 79 mg/dL (ref 65–99)

## 2017-05-05 LAB — PHOSPHORUS: Phosphorus: 1.9 mg/dL — ABNORMAL LOW (ref 2.5–4.6)

## 2017-05-05 MED ORDER — POTASSIUM CHLORIDE 20 MEQ/15ML (10%) PO SOLN: 40.0000 meq | Freq: Once | ORAL | Status: DC

## 2017-05-05 MED ORDER — POTASSIUM CHLORIDE 10 MEQ/100ML IV SOLN
10.0000 meq | INTRAVENOUS | Status: AC
  Administered 2017-05-05 (×4): 10 meq via INTRAVENOUS
  Filled 2017-05-05 (×4): qty 100

## 2017-05-05 MED ORDER — FENTANYL CITRATE (PF) 100 MCG/2ML IJ SOLN
25.0000 ug | INTRAMUSCULAR | Status: DC | PRN
  Administered 2017-05-05 – 2017-05-07 (×14): 50 ug via INTRAVENOUS
  Filled 2017-05-05 (×14): qty 2

## 2017-05-05 MED ORDER — CEFEPIME HCL 2 G IJ SOLR
2.0000 g | Freq: Two times a day (BID) | INTRAMUSCULAR | Status: DC
  Administered 2017-05-05 (×2): 2 g via INTRAVENOUS
  Filled 2017-05-05 (×3): qty 2

## 2017-05-05 MED ORDER — MAGNESIUM SULFATE 2 GM/50ML IV SOLN
2.0000 g | Freq: Once | INTRAVENOUS | Status: AC
  Administered 2017-05-05: 2 g via INTRAVENOUS
  Filled 2017-05-05: qty 50

## 2017-05-05 MED ORDER — ORAL CARE MOUTH RINSE
15.0000 mL | Freq: Two times a day (BID) | OROMUCOSAL | Status: DC
  Administered 2017-05-06 – 2017-05-10 (×10): 15 mL via OROMUCOSAL

## 2017-05-05 MED ORDER — SODIUM CHLORIDE 0.9 % IV SOLN
INTRAVENOUS | Status: DC
  Administered 2017-05-05 – 2017-05-06 (×2): via INTRAVENOUS

## 2017-05-05 MED ORDER — POTASSIUM PHOSPHATES 15 MMOLE/5ML IV SOLN
30.0000 mmol | Freq: Once | INTRAVENOUS | Status: AC
  Administered 2017-05-05: 30 mmol via INTRAVENOUS
  Filled 2017-05-05: qty 10

## 2017-05-05 MED ORDER — CLONIDINE HCL 0.2 MG/24HR TD PTWK
0.2000 mg | MEDICATED_PATCH | TRANSDERMAL | Status: DC
  Administered 2017-05-05: 0.2 mg via TRANSDERMAL
  Filled 2017-05-05: qty 1

## 2017-05-05 NOTE — Progress Notes (Signed)
[place] Progress Note Patient Name: Joel Mathis DOB: 1980-12-23 MRN: [id number]   Date of Service  05/05/2017  HPI/Events of Note  Hypokalemia and hypophos  eICU Interventions  Potassium and phos replaced     Intervention Category Intermediate Interventions: Electrolyte abnormality - evaluation and management  DETERDING,ELIZABETH 05/05/2017, 5:27 AM

## 2017-05-05 NOTE — Progress Notes (Signed)
PULMONARY / CRITICAL CARE MEDICINE   Name: Joel Mathis MRN: [id number] DOB: 03-12-1981    ADMISSION DATE:  05/03/2017   REFERRING MD: Emergency department physician  CHIEF COMPLAINT: Lingual bleeding  HISTORY OF PRESENT ILLNESS:   [name] who while in prison had left ear pain.  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  He presented to the intensive care unit post interventional radiology intervention for embolization of lingual bleeding.  Subsequently had other lingual bleeding copious amounts.  His blood pressure was artificially lowered to keep him around systolic blood pressure 448 to decrease bleeding.  Interventional radiology has reconsulted and is at the bedside.  SUBJECTIVE:  No events overnight, no new complaints  VITAL SIGNS: BP (!) 152/69   Pulse (!) 113   Temp 99.8 F (37.7 C) (Oral)   Resp (!) 30   Ht 6\' 5"  (1.956 m)   Wt 103.9 kg (229 lb 0.9 oz)   SpO2 99%   BMI 27.16 kg/m   HEMODYNAMICS:    VENTILATOR SETTINGS: Vent Mode: CPAP;PSV FiO2 (%):  [40 %] 40 % Set Rate:  [14 bmp-15 bmp] 15 bmp Vt Set:  [630 mL-700 mL] 630 mL PEEP:  [5 cmH20] 5 cmH20 Pressure Support:  [12 cmH20] 12 cmH20 Plateau Pressure:  [18 cmH20-24 cmH20] 24 cmH20  INTAKE / OUTPUT: I/O last 3 completed shifts: In: 5040.8 [I.V.:4440.8; IV Piggyback:600] Out: 3025 [Urine:3025]  PHYSICAL EXAMINATION: General: Stable appearing, NAD Neuro: Arousable, moving all ext to command HEENT: [place]/AT, PERRL, EOM-I and MMM Cardiovascular: RRR, Nl S1/S2, -M/R/G Lungs: CTA bilaterally Abdomen: Soft, NT, ND and +BS Musculoskeletal: -edema and -tenderness Skin: Intact  LABS:  BMET Recent Labs  Lab 05/03/17 1344 05/04/17 0409 05/05/17 0435  NA 137 135 134*  K 5.0 3.5 3.3*  CL 114* 109 107  CO2 18* 20* 20*  BUN 13 9 6   CREATININE 0.92 0.93 0.89  GLUCOSE 140* 99 109*   Electrolytes Recent  Labs  Lab 05/03/17 1344 05/04/17 0409 05/05/17 0435  CALCIUM 7.2* 7.6* 7.9*  MG 1.7 1.7 1.9  PHOS 2.1* 2.0* 1.9*   CBC Recent Labs  Lab 05/03/17 2250 05/04/17 0409 05/05/17 0435  WBC 10.1 11.4* 15.3*  HGB 9.4* 9.6* 9.5*  HCT 27.8* 28.3* 29.0*  PLT 183 184 211   Coag's Recent Labs  Lab 05/03/17 0839 05/03/17 1344 05/03/17 2250  APTT 23* 29  --   INR 1.22 1.16 1.15   Sepsis Markers Recent Labs  Lab 05/03/17 0812 05/03/17 1344 05/03/17 2328  LATICACIDVEN 1.51 0.9 0.8  PROCALCITON  --  0.34  --    ABG Recent Labs  Lab 05/03/17 1350 05/04/17 0409 05/05/17 0340  PHART 7.374 7.347* 7.383  PCO2ART 33.1 34.2 35.2  PO2ART 456.0* 175.0* 160*   Liver Enzymes Recent Labs  Lab 05/03/17 1344  AST 15  ALT 29  ALKPHOS 50  BILITOT 0.7  ALBUMIN 2.7*   Cardiac Enzymes No results for input(s): TROPONINI, PROBNP in the last 168 hours.  Glucose Recent Labs  Lab 05/03/17 1618 05/03/17 2351 05/04/17 0335 05/05/17 0019  GLUCAP 101* 96 89 79   Imaging Dg Chest Port 1 View  Result Date: 05/05/2017 CLINICAL DATA:  36 year old with male status post intubation. EXAM: PORTABLE CHEST 1 VIEW COMPARISON:  05/04/2017 FINDINGS: Endotracheal tube again terminates approximately 5 cm above the level of the carina. Cardiomediastinal silhouette is unchanged. Note is made of low lung  volumes with bronchovascular crowding. No focal consolidation, sizable effusion or pneumothorax. No acute osseous abnormalities. IMPRESSION: Stable appearance of the chest with endotracheal tube in place. Persistent low lung volumes and bibasilar atelectasis. Electronically Signed   By: Kristopher Oppenheim M.D.   On: 05/05/2017 07:39   STUDIES:    CULTURES:   ANTIBIOTICS:   SIGNIFICANT EVENTS: 05/03/2017 intubated for interventional radiology procedure of embolization of lingual bleed.  His airway was difficult secondary to copious bleeding 05/03/2017 after coming to the intensive care unit becoming  hypertensive.  Blood pressure 170 he had copious lingual arterial bleeding.  He was sedated pressure was applied trauma cath was inserted interventional radiology has been contacted and he is to return for further interventions.  LINES/TUBES: 05/03/2017 endotracheal tube>>  DISCUSSION: 36 year old male who had resection of left squamous cell lingual cancer at [facility] approximately 2 weeks ago.  He noted left ear pain while he was in prison. He presented to the [facility] with copious lingual bleeding.  He was taken to interventional radiology by Dr. Matthew Saras and had embolization of the lingual artery.  He remains intubated at this time is noted to have a mass in the airway.  But not a difficult intubation according to the CRNA who intubated him.  Major complication was copious blood in the airway.  05/03/2017 endotracheal tube  ASSESSMENT / PLAN:  PULMONARY A: Vent dependent respiratory failure secondary to lingual bleeding in the setting of recent squamous cell resection of left lingual area. According anesthesia his airway did show evidence of a mass.  But difficulty of intubation secondary to bloody view rather than anatomy of the airway.  P:   SBT to extubate today if cuff leak is condusive Vent bundle Titrate O2 for sat of 88-92%  CARDIOVASCULAR A:  Currently on Neo-Synephrine drip most likely from propofol sedation. No known history of cardiovascular disease in a 36 year old. P:  Anti-HTN as ordered Tele monitoring Clonidine 0.2 patch  RENAL Lab Results  Component Value Date   CREATININE 0.89 05/05/2017   CREATININE 0.93 05/04/2017   CREATININE 0.92 05/03/2017   Recent Labs  Lab 05/03/17 1344 05/04/17 0409 05/05/17 0435  K 5.0 3.5 3.3*   A:   Mild renal insufficiency P:   BMET in AM Replace electrolytes as indicated Mg, K and Phos to be replaced today KVO IVF  GASTROINTESTINAL A:   Currently n.p.o. Questionable ability to eat with recent  head neck cancer resection P:   No TF  HEMATOLOGIC Recent Labs    05/04/17 0409 05/05/17 0435  HGB 9.6* 9.5*   Lab Results  Component Value Date   INR 1.15 05/03/2017   INR 1.16 05/03/2017   INR 1.22 05/03/2017   A:   Current lingual bleeding post interventional radiology  embolization P:  Monitor hemoglobin Monitor coags  INFECTIOUS A:   Recent left lingual resection approximately 2 weeks ago at [facility] for squamous cell carcinoma. No overt signs and symptoms of infection at this time P:   Monitor white count temperature and other indicators of infection. Hold off abx for now  ENDOCRINE A:   No acute issues P:   Monitor glucose on labs  NEUROLOGIC A:   Intubated and sedated currently on propofol drip P:   RASS goal: -1 D/C sedation for extubation today if there is a cuff leak  FAMILY  - Updates: No family bedside to update  - Inter-disciplinary family meet or Palliative Care meeting due by:  day 7  The patient is critically ill with multiple organ systems failure and requires high complexity decision making for assessment and support, frequent evaluation and titration of therapies, application of advanced monitoring technologies and extensive interpretation of multiple databases.   Critical Care Time devoted to patient care services described in this note is  35  Minutes. This time reflects time of care of this signee Dr Jennet Maduro. This critical care time does not reflect procedure time, or teaching time or supervisory time of PA/NP/Med student/Med Resident etc but could involve care discussion time.  Rush Farmer, M.D. [facility] Pulmonary/Critical Care Medicine. Pager: [phone]. After hours pager: [phone].  05/05/2017, 10:02 AM

## 2017-05-05 NOTE — Progress Notes (Signed)
Fentanyl IV discontinued. 245ml wasted in sink witnessed by [employer]

## 2017-05-05 NOTE — Procedures (Signed)
Extubation Procedure Note  Patient Details:   Name: Joel Mathis DOB: 08/06/1980 MRN: [id number]   Airway Documentation:     Evaluation  O2 sats: stable throughout Complications: No apparent complications Patient did tolerate procedure well. Bilateral Breath Sounds: Clear   Yes   Pt extubated to 4L [place] with sats of 99%.  Pt tolerated well, pt had + cuff leak.  RT will continue to monitor.  Pierre Bali 05/05/2017, 11:17 AM

## 2017-05-06 ENCOUNTER — Encounter (HOSPITAL_COMMUNITY): Payer: Self-pay | Admitting: Interventional Radiology

## 2017-05-06 DIAGNOSIS — J189 Pneumonia, unspecified organism: Secondary | ICD-10-CM

## 2017-05-06 LAB — BPAM RBC
BLOOD PRODUCT EXPIRATION DATE: 201812272359
BLOOD PRODUCT EXPIRATION DATE: 201812312359
BLOOD PRODUCT EXPIRATION DATE: 201901012359
BLOOD PRODUCT EXPIRATION DATE: 201901012359
BLOOD PRODUCT EXPIRATION DATE: 201901022359
BLOOD PRODUCT EXPIRATION DATE: 201901022359
Blood Product Expiration Date: 201812272359
Blood Product Expiration Date: 201812312359
Blood Product Expiration Date: 201812312359
Blood Product Expiration Date: 201901012359
ISSUE DATE / TIME: 201812070910
ISSUE DATE / TIME: 201812070910
ISSUE DATE / TIME: 201812070910
ISSUE DATE / TIME: 201812070910
ISSUE DATE / TIME: 201812071152
ISSUE DATE / TIME: 201812071152
UNIT TYPE AND RH: 7300
UNIT TYPE AND RH: 7300
UNIT TYPE AND RH: 7300
UNIT TYPE AND RH: 7300
UNIT TYPE AND RH: 7300
Unit Type and Rh: 7300
Unit Type and Rh: 7300
Unit Type and Rh: 7300
Unit Type and Rh: 7300
Unit Type and Rh: 7300

## 2017-05-06 LAB — TYPE AND SCREEN
ABO/RH(D): B POS
ANTIBODY SCREEN: NEGATIVE
UNIT DIVISION: 0
UNIT DIVISION: 0
UNIT DIVISION: 0
UNIT DIVISION: 0
UNIT DIVISION: 0
Unit division: 0
Unit division: 0
Unit division: 0
Unit division: 0
Unit division: 0

## 2017-05-06 LAB — GLUCOSE, CAPILLARY
GLUCOSE-CAPILLARY: 119 mg/dL — AB (ref 65–99)
GLUCOSE-CAPILLARY: 103 mg/dL — AB (ref 65–99)
GLUCOSE-CAPILLARY: 98 mg/dL (ref 65–99)
Glucose-Capillary: 91 mg/dL (ref 65–99)
Glucose-Capillary: 105 mg/dL — ABNORMAL HIGH (ref 65–99)

## 2017-05-06 LAB — BPAM FFP
BLOOD PRODUCT EXPIRATION DATE: 201812122359
BLOOD PRODUCT EXPIRATION DATE: 201812122359
Blood Product Expiration Date: 201812122359
Blood Product Expiration Date: 201812122359
ISSUE DATE / TIME: 201812071231
ISSUE DATE / TIME: 201812071356
ISSUE DATE / TIME: 201812090856
ISSUE DATE / TIME: 201812071356
UNIT TYPE AND RH: 7300
UNIT TYPE AND RH: 7300
Unit Type and Rh: 7300
Unit Type and Rh: 7300

## 2017-05-06 LAB — POCT I-STAT 7, (LYTES, BLD GAS, ICA,H+H)
ACID-BASE DEFICIT: 2 mmol/L (ref 0.0–2.0)
Acid-base deficit: 6 mmol/L — ABNORMAL HIGH (ref 0.0–2.0)
BICARBONATE: 23.9 mmol/L (ref 20.0–28.0)
BICARBONATE: 20.3 mmol/L (ref 20.0–28.0)
CALCIUM ION: 1.12 mmol/L — AB (ref 1.15–1.40)
Calcium, Ion: 1.05 mmol/L — ABNORMAL LOW (ref 1.15–1.40)
HCT: 16 % — ABNORMAL LOW (ref 39.0–52.0)
HCT: 20 % — ABNORMAL LOW (ref 39.0–52.0)
HEMOGLOBIN: 5.4 g/dL — AB (ref 13.0–17.0)
Hemoglobin: 6.8 g/dL — CL (ref 13.0–17.0)
O2 Saturation: 100 %
O2 Saturation: 100 %
PCO2 ART: 40 mmHg (ref 32.0–48.0)
PO2 ART: 284 mmHg — AB (ref 83.0–108.0)
POTASSIUM: 4.6 mmol/L (ref 3.5–5.1)
Patient temperature: 36.3
Potassium: 5.3 mmol/L — ABNORMAL HIGH (ref 3.5–5.1)
SODIUM: 141 mmol/L (ref 135–145)
SODIUM: 140 mmol/L (ref 135–145)
TCO2: 25 mmol/L (ref 22–32)
TCO2: 22 mmol/L (ref 22–32)
pCO2 arterial: 46.3 mmHg (ref 32.0–48.0)
pH, Arterial: 7.317 — ABNORMAL LOW (ref 7.350–7.450)
pH, Arterial: 7.31 — ABNORMAL LOW (ref 7.350–7.450)
pO2, Arterial: 540 mmHg — ABNORMAL HIGH (ref 83.0–108.0)

## 2017-05-06 LAB — PREPARE FRESH FROZEN PLASMA
UNIT DIVISION: 0
Unit division: 0
Unit division: 0
Unit division: 0

## 2017-05-06 LAB — CBC
HEMATOCRIT: 30.6 % — AB (ref 39.0–52.0)
Hemoglobin: 9.8 g/dL — ABNORMAL LOW (ref 13.0–17.0)
MCH: 25.7 pg — AB (ref 26.0–34.0)
MCHC: 32 g/dL (ref 30.0–36.0)
MCV: 80.1 fL (ref 78.0–100.0)
PLATELETS: 247 10*3/uL (ref 150–400)
RBC: 3.82 MIL/uL — AB (ref 4.22–5.81)
RDW: 15.7 % — ABNORMAL HIGH (ref 11.5–15.5)
WBC: 18.9 10*3/uL — AB (ref 4.0–10.5)

## 2017-05-06 LAB — BASIC METABOLIC PANEL
ANION GAP: 9 (ref 5–15)
BUN: 6 mg/dL (ref 6–20)
CHLORIDE: 101 mmol/L (ref 101–111)
CO2: 21 mmol/L — AB (ref 22–32)
CREATININE: 0.87 mg/dL (ref 0.61–1.24)
Calcium: 8.3 mg/dL — ABNORMAL LOW (ref 8.9–10.3)
GFR calc Af Amer: >60 mL/min (ref >60)
GFR calc non Af Amer: >60 mL/min (ref >60)
GLUCOSE: 114 mg/dL — AB (ref 65–99)
POTASSIUM: 4.1 mmol/L (ref 3.5–5.1)
Sodium: 131 mmol/L — ABNORMAL LOW (ref 135–145)

## 2017-05-06 LAB — PHOSPHORUS: Phosphorus: 2.4 mg/dL — ABNORMAL LOW (ref 2.5–4.6)

## 2017-05-06 LAB — POCT ACTIVATED CLOTTING TIME
ACTIVATED CLOTTING TIME: 164 s
ACTIVATED CLOTTING TIME: 153 s
Activated Clotting Time: 169 seconds

## 2017-05-06 LAB — MAGNESIUM: Magnesium: 2 mg/dL (ref 1.7–2.4)

## 2017-05-06 LAB — PROCALCITONIN: Procalcitonin: 0.31 ng/mL

## 2017-05-06 MED ORDER — SODIUM CHLORIDE 0.9 % IV SOLN
3.0000 g | Freq: Four times a day (QID) | INTRAVENOUS | Status: DC
  Administered 2017-05-06 – 2017-05-11 (×23): 3 g via INTRAVENOUS
  Filled 2017-05-06 (×26): qty 3

## 2017-05-06 NOTE — Progress Notes (Signed)
Patient ID: Joel Mathis, male   DOB: 1980-09-16, 36 y.o.   MRN: [id number]    Referring Physician(s): Dr. Jennet Maduro  Supervising Physician: Aletta Edouard  Patient Status: [facility] - In-pt  Chief Complaint: Lingual bleeding  Subjective: Patient doesn't speak, but nods yes to pain in his mouth.  No further bleeding.  Allergies: Patient has no known allergies.  Medications: Prior to Admission medications   Medication Sig Start Date End Date Taking? Authorizing Provider  acetaminophen (TYLENOL) 500 MG tablet Take 1,000 mg by mouth every 6 (six) hours as needed for mild pain.   Yes [provider]  naproxen sodium (ANAPROX) 220 MG tablet Take 220 mg by mouth 2 (two) times daily as needed (for pain).   Yes [provider]    Vital Signs: BP (!) 150/79   Pulse (!) 108   Temp 99.7 F (37.6 C) (Axillary)   Resp (!) 24   Ht 6\' 5"  (1.956 m)   Wt 223 lb 5.2 oz (101.3 kg)   SpO2 100%   BMI 26.48 kg/m   Physical Exam: Mouth: no further bleeding noted Skin: R CFA site was healing well and c/d/i  Imaging: Ct Angio Neck W And/or Wo Contrast  Result Date: 05/03/2017 CLINICAL DATA:  Surgery for a tongue mass 2 weeks ago.  Hematemesis. EXAM: CT ANGIOGRAPHY NECK TECHNIQUE: Multidetector CT imaging of the neck was performed using the standard protocol during bolus administration of intravenous contrast. Multiplanar CT image reconstructions and MIPs were obtained to evaluate the vascular anatomy. Carotid stenosis measurements (when applicable) are obtained utilizing NASCET criteria, using the distal internal carotid diameter as the denominator. CONTRAST:  37mL ISOVUE-370 IOPAMIDOL (ISOVUE-370) INJECTION 76% COMPARISON:  Soft tissue neck CT 01/04/2014 FINDINGS: Aortic arch: Standard 3 vessel aortic arch. Brachiocephalic and subclavian arteries are widely patent. Right carotid system: Patent without evidence of stenosis, dissection, or aneurysm. Left carotid system: Patent  without evidence of stenosis, dissection, or aneurysm. Vertebral arteries: Patent and codominant without evidence of stenosis or dissection. Skeleton: No acute osseous abnormality or suspicious osseous lesion. Other neck: There is a postoperative defect in the left tongue base consistent with history of recent tumor resection. There is some attenuation of the left lingual artery in the operative region, however no active extravasation or pseudoaneurysm is identified. There is some asymmetric soft tissue fullness/swelling laterally in the left oropharynx. This examination was not tailored to assess the soft tissues, and correlation with any more recent outside preoperative imaging is suggested. A 9 mm short axis right level IIa lymph node is unchanged from 2015. Upper chest: Partially visualized patchy ground-glass opacities in the posterior right upper lobe. IMPRESSION: 1. Postoperative changes from recent tongue base mass resection. No active arterial extravasation or pseudoaneurysm identified in this region. 2. Partially visualized pulmonary ground-glass opacities in the right upper lobe which could represent infection or hemorrhage. Electronically Signed   By: Logan Bores M.D.   On: 05/03/2017 07:20   Dg Chest Port 1 View  Result Date: 05/05/2017 CLINICAL DATA:  36 year old with male status post intubation. EXAM: PORTABLE CHEST 1 VIEW COMPARISON:  05/04/2017 FINDINGS: Endotracheal tube again terminates approximately 5 cm above the level of the carina. Cardiomediastinal silhouette is unchanged. Note is made of low lung volumes with bronchovascular crowding. No focal consolidation, sizable effusion or pneumothorax. No acute osseous abnormalities. IMPRESSION: Stable appearance of the chest with endotracheal tube in place. Persistent low lung volumes and bibasilar atelectasis. Electronically Signed   By: Kristopher Oppenheim  M.D.   On: 05/05/2017 07:39   Dg Chest Port 1 View  Result Date: 05/04/2017 CLINICAL DATA:   36 year old male with respiratory failure EXAM: PORTABLE CHEST 1 VIEW COMPARISON:  Prior chest x-ray 05/03/2017 FINDINGS: Patient remains intubated. The tip of the endotracheal tube is 5.3 cm above the carina. Low lung volumes with mild bibasilar atelectasis. The lungs are otherwise clear. Cardiac mediastinal contours remain unchanged. No acute osseous abnormality. IMPRESSION: 1. Stable position of endotracheal tube. 2. Persistent low lung volumes with bibasilar atelectasis. Electronically Signed   By: Jacqulynn Cadet M.D.   On: 05/04/2017 08:57   Dg Chest Port 1 View  Result Date: 05/03/2017 CLINICAL DATA:  Central line placement. EXAM: PORTABLE CHEST 1 VIEW COMPARISON:  None. FINDINGS: Endotracheal tube in place with the tip approximately 2.9 cm above the level of the carina. No central venous catheter visualized. The cardiomediastinal silhouette is normal in size. Normal pulmonary vascularity. Low lung volumes. No focal consolidation, pleural effusion, or pneumothorax. No acute osseous abnormality. IMPRESSION: 1. No central line visualized. Please note the indication for the study is central line placement. 2. Endotracheal tube in appropriate position. No active cardiopulmonary disease. Electronically Signed   By: Titus Dubin M.D.   On: 05/03/2017 13:42    Labs:  CBC: Recent Labs    05/03/17 2250 05/04/17 0409 05/05/17 0435 05/06/17 0442  WBC 10.1 11.4* 15.3* 18.9*  HGB 9.4* 9.6* 9.5* 9.8*  HCT 27.8* 28.3* 29.0* 30.6*  PLT 183 184 211 247    COAGS: Recent Labs    05/03/17 0839 05/03/17 1344 05/03/17 2250  INR 1.22 1.16 1.15  APTT 23* 29  --     BMP: Recent Labs    05/03/17 1344 05/04/17 0409 05/05/17 0435 05/06/17 0442  NA 137 135 134* 131*  K 5.0 3.5 3.3* 4.1  CL 114* 109 107 101  CO2 18* 20* 20* 21*  GLUCOSE 140* 99 109* 114*  BUN 13 9 6 6   CALCIUM 7.2* 7.6* 7.9* 8.3*  CREATININE 0.92 0.93 0.89 0.87  GFRNONAA >60 >60 >60 >60  GFRAA >60 >60 >60 >60     LIVER FUNCTION TESTS: Recent Labs    05/03/17 1344  BILITOT 0.7  AST 15  ALT 29  ALKPHOS 50  PROT 4.7*  ALBUMIN 2.7*    Assessment and Plan: 1. Lingual bleeding, s/p embolization x2 No further bleeding.  hgb stable.  He has been extubated.  No further IR needs.    Electronically Signed: Henreitta Cea 05/06/2017, 11:33 AM   I spent a total of 15 Minutes at the the patient's bedside AND on the patient's hospital floor or unit, greater than 50% of which was counseling/coordinating care for lingual bleeding

## 2017-05-06 NOTE — Evaluation (Signed)
Clinical/Bedside Swallow Evaluation Patient Details  Name: Joel Mathis MRN: [id number] Date of Birth: August 16, 1980  Today's Date: 05/06/2017 Time: SLP Start Time (ACUTE ONLY): 4166 SLP Stop Time (ACUTE ONLY): 0926 SLP Time Calculation (min) (ACUTE ONLY): 10 min  Past Medical History:  Past Medical History:  Diagnosis Date  . 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 TONGUE SURGERY     HPI:  Pt is a 36 yo male admitted with copious amounts of lingual bleeding s/p embolization in IR x1. Pt had recent resection of L squamous cell lingual cancer at [facility] (~2 weeks prior to admission). Pt was intubated 12/7-12/9. A mass was found in his airway during the intubation. No other PMH is known.   Assessment / Plan / Recommendation Clinical Impression  Pt has no observable lingual movement and complaints of 10/10 lingual pain. SLP attempted to palpate a volitional swallow, but this was very guarded due to pain level. Pt believes that he has been able to swallow his saliva, but during skilled observation it spills passively from his oral cavity. PTA he says that he was able to consume liquids only after his oral surgery, but that he was having no difficulty doing that.   At this point pt would have likely no use of his tongue for oral manipulation, oral transer, or pharyngeal propulsion. He also is so limited by pain that I do not think attempting small amounts of liquids/ice would given him any comfort. Recommend remaining NPO for now with focus on swallowing his secretions - will f/u for potential PO readiness as some of his pain levels decrease.   SLP Visit Diagnosis: Dysphagia, oropharyngeal phase (R13.12)    Aspiration Risk  Risk for inadequate nutrition/hydration;Moderate aspiration risk    Diet Recommendation NPO;Alternative means - temporary   Medication Administration: Via alternative means    Other   Recommendations Oral Care Recommendations: Oral care QID Other Recommendations: Have oral suction available   Follow up Recommendations Outpatient SLP;Home health SLP      Frequency and Duration min 2x/week  2 weeks       Prognosis Prognosis for Safe Diet Advancement: Fair Barriers to Reach Goals: Severity of deficits      Swallow Study   General HPI: Pt is a 36 yo male admitted with copious amounts of lingual bleeding s/p embolization in IR x1. Pt had recent resection of L squamous cell lingual cancer at [facility] (~2 weeks prior to admission). Pt was intubated 12/7-12/9. A mass was found in his airway during the intubation. No other PMH is known. Type of Study: Bedside Swallow Evaluation Previous Swallow Assessment: none in chart - denies any h/o dyspghagia Diet Prior to this Study: NPO Temperature Spikes Noted: Yes(101.6) Respiratory Status: Nasal cannula History of Recent Intubation: Yes Length of Intubations (days): 3 days Date extubated: 05/05/17 Behavior/Cognition: Alert;Cooperative Oral Cavity Assessment: Excessive secretions;Other (comment)(abrasion on R anterior portion of tongue) Oral Care Completed by SLP: No(pt reporting 10/10 pain ) Oral Cavity - Dentition: Adequate natural dentition Patient Positioning: Upright in bed Baseline Vocal Quality: Normal;Other (comment)(although speech very dysarthric) Volitional Swallow: Able to elicit(but limited by pain)    Oral/Motor/Sensory Function Overall Oral Motor/Sensory Function: Severe impairment(secondary to pain, recent procedures) Lingual ROM: Reduced right;Reduced left;Other (Comment)(NO lingual movement observed)   Ice Chips Ice chips: Not tested   Thin Liquid Thin Liquid: Not tested    Nectar Thick Nectar Thick Liquid:  Not tested   Honey Thick Honey Thick Liquid: Not tested   Puree Puree: Not tested   Solid   GO   Solid: Not tested        [name] 05/06/2017,9:37 AM  [name],  M.A. CCC-SLP [phone]

## 2017-05-06 NOTE — Progress Notes (Signed)
PULMONARY / CRITICAL CARE MEDICINE   Name: Joel Mathis MRN: [id number] DOB: 06/03/1980    ADMISSION DATE:  05/03/2017   REFERRING MD: Emergency department physician  CHIEF COMPLAINT: Lingual bleeding  HISTORY OF PRESENT ILLNESS:   [name] who while in prison had left ear pain.  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  He presented to the intensive care unit post interventional radiology intervention for embolization of lingual bleeding.  Subsequently had other lingual bleeding copious amounts, repeat embolization.  SUBJECTIVE:  No evidence of further bleeding Saliva but otherwise managing secretions   VITAL SIGNS: BP (!) 165/85 (BP Location: Left Arm)   Pulse (!) 107   Temp 99.7 F (37.6 C) (Axillary)   Resp (!) 24   Ht 6\' 5"  (1.956 m)   Wt 101.3 kg (223 lb 5.2 oz)   SpO2 100%   BMI 26.48 kg/m   HEMODYNAMICS:    VENTILATOR SETTINGS: Vent Mode: CPAP;PSV FiO2 (%):  [40 %] 40 % PEEP:  [5 cmH20] 5 cmH20 Pressure Support:  [5 cmH20] 5 cmH20  INTAKE / OUTPUT: I/O last 3 completed shifts: In: 3425.3 [I.V.:2375.3; IV Piggyback:1050] Out: 4250 [Urine:4250]  PHYSICAL EXAMINATION: General: Ill-appearing, normal body habitus, no distress Neuro: Wakes easily, follows commands, HEENT: Unable to phonate, some saliva that he appears to have difficulty swallowing, marginal cough, no evidence of blood from the oropharynx Cardiovascular: Regular, no murmur Lungs: Decreased at both bases, otherwise clear, no wheezing or crackles Abdomen: Soft, benign, positive bowel sounds Musculoskeletal: No deformities, no edema Skin: No apparent rash  LABS:  BMET Recent Labs  Lab 05/04/17 0409 05/05/17 0435 05/06/17 0442  NA 135 134* 131*  K 3.5 3.3* 4.1  CL 109 107 101  CO2 20* 20* 21*  BUN 9 6 6   CREATININE 0.93 0.89 0.87  GLUCOSE 99 109* 114*   Electrolytes Recent Labs  Lab  05/04/17 0409 05/05/17 0435 05/06/17 0442  CALCIUM 7.6* 7.9* 8.3*  MG 1.7 1.9 2.0  PHOS 2.0* 1.9* 2.4*   CBC Recent Labs  Lab 05/04/17 0409 05/05/17 0435 05/06/17 0442  WBC 11.4* 15.3* 18.9*  HGB 9.6* 9.5* 9.8*  HCT 28.3* 29.0* 30.6*  PLT 184 211 247   Coag's Recent Labs  Lab 05/03/17 0839 05/03/17 1344 05/03/17 2250  APTT 23* 29  --   INR 1.22 1.16 1.15   Sepsis Markers Recent Labs  Lab 05/03/17 0812 05/03/17 1344 05/03/17 2328 05/05/17 1153 05/06/17 0442  LATICACIDVEN 1.51 0.9 0.8  --   --   PROCALCITON  --  0.34  --  0.18 0.31   ABG Recent Labs  Lab 05/03/17 1350 05/04/17 0409 05/05/17 0340  PHART 7.374 7.347* 7.383  PCO2ART 33.1 34.2 35.2  PO2ART 456.0* 175.0* 160*   Liver Enzymes Recent Labs  Lab 05/03/17 1344  AST 15  ALT 29  ALKPHOS 50  BILITOT 0.7  ALBUMIN 2.7*   Cardiac Enzymes No results for input(s): TROPONINI, PROBNP in the last 168 hours.  Glucose Recent Labs  Lab 05/03/17 1618 05/03/17 2351 05/04/17 0335 05/05/17 0019 05/06/17 0734  GLUCAP 101* 96 89 79 119*   Imaging No results found. STUDIES:    CULTURES: Blood 12/9 >>  Urine 12/9 >>   ANTIBIOTICS: Cefepime 12/9 >> 12/10 unasyn 12/10 >>   SIGNIFICANT EVENTS: 05/03/2017 intubated for interventional radiology procedure of embolization of lingual bleed.  His airway was difficult secondary to copious bleeding  05/03/2017 after coming to the intensive care unit becoming hypertensive.  Blood pressure 170 he had copious lingual arterial bleeding.  He was sedated pressure was applied trauma cath was inserted interventional radiology has been contacted and he is to return for further interventions.  LINES/TUBES: 05/03/2017 endotracheal tube>> 12/9 DISCUSSION: 35 year old male who had resection of left squamous cell lingual cancer at [facility] approximately 2 weeks ago.  He noted left ear pain while he was in prison. He presented to the [facility] with  copious lingual bleeding.  He was taken to interventional radiology and had embolization of the lingual artery.  He remains intubated at this time is noted to have a mass in the airway.  Extubated 12/9  ASSESSMENT / PLAN:  PULMONARY A: Vent dependent respiratory failure secondary to lingual bleeding in the setting of recent squamous cell resection of left lingual area. According anesthesia his airway did show evidence of a mass.  But difficulty of intubation secondary to bloody view rather than anatomy of the airway.  P:   Tolerated extubation Post pulmonary hygiene Suspect that he will be unable to pass a swallowing evaluation, he did not phonate 12/10 Likely will require ENT follow-up Wean oxygen as able Vent bundle Titrate O2 for sat of 88-92%  CARDIOVASCULAR A:  Currently on Neo-Synephrine drip most likely from propofol sedation. No known history of cardiovascular disease in a 36 year old. P:  Clonidine patch in place Metoprolol IV as needed Hydralazine IV as needed  RENAL A:   Mild renal insufficiency, resolved P:   Follow urine output, BMP Replace electro lites as indicated Avoid nephrotoxins and ensure adequate renal perfusion   GASTROINTESTINAL A:   Currently n.p.o. Questionable ability to eat with recent head neck cancer resection P:   Do not anticipate that he will be able to take p.o., we will place core tract tube to facilitate tube feeding and medications  HEMATOLOGIC A:   Lingual bleeding post interventional radiology  Embolization, not actively bleeding P:  Follow CBC and INR  INFECTIOUS A:   Recent left lingual resection approximately 2 weeks ago at [facility] for squamous cell carcinoma. Febrile + leukocytosis 12/9, consider HCAP, consider oral pharyngeal infection P:   Given potential oropharyngeal source either locally or pulmonary, will change cefepime to unasyn to cover anaerobes.  Follow culture data, obtain respiratory culture with  possible Follow chest x-ray  ENDOCRINE A:   No acute issues P:   Follow glucose on BMP  NEUROLOGIC A:   Sedation to facilitate mechanical ventilation, now extubated P:   RASS goal: 0 As needed pain control with narcotics, hold all sedating medications  FAMILY  - Updates: No family bedside to update  Independent critical care time 31 minutes  Baltazar Apo, MD, PhD 05/06/2017, 9:13 AM [facility] [phone] or if no answer [phone]

## 2017-05-06 NOTE — Progress Notes (Signed)
Pharmacy Antibiotic Note  Joel Mathis is a 36 y.o. male admitted on 05/03/2017 with recent lingual resection with bleeding.  Concern for possible oral infection versus HCAP after new fever 12/9. Pharmacy has been consulted to change Unasyn dosing with low threshold to broaden if worsens.   Plan: Unasyn 3g IV every 6 hours.  Monitor fever curve, clinical response.  Monitor renal function.   Height: 6\' 5"  (195.6 cm) Weight: 223 lb 5.2 oz (101.3 kg) IBW/kg (Calculated) : 89.1  Temp (24hrs), Avg:100.3 F (37.9 C), Min:99.7 F (37.6 C), Max:101.6 F (38.7 C)  Recent Labs  Lab 04/29/17 2042 05/03/17 0505 05/03/17 0812 05/03/17 1344 05/03/17 2250 05/03/17 2328 05/04/17 0409 05/05/17 0435 05/06/17 0442  WBC  --  9.5  --  10.9* 10.1  --  11.4* 15.3* 18.9*  CREATININE 1.20 1.48*  --  0.92  --   --  0.93 0.89 0.87  LATICACIDVEN 1.15  --  1.51 0.9  --  0.8  --   --   --     Estimated Creatinine Clearance: 147.9 mL/min (by C-G formula based on SCr of 0.87 mg/dL).    No Known Allergies  Antimicrobials this admission: Cefepime 12/9 >>12/10 Unasyn 12/10 >>  Dose adjustments this admission:   Microbiology results: 12/9 Blood x 2  12/9 Urine 12/9 MRSA pcr negative  Thank you for allowing pharmacy to be a part of this patient's care.  Sloan Leiter, PharmD, BCPS, BCCCP Clinical Pharmacist Clinical phone 05/06/2017 until 3:[phone] After hours, please call #[phone] 05/06/2017 9:19 AM

## 2017-05-07 LAB — CBC
HEMATOCRIT: 30.1 % — AB (ref 39.0–52.0)
Hemoglobin: 9.9 g/dL — ABNORMAL LOW (ref 13.0–17.0)
MCH: 26.2 pg (ref 26.0–34.0)
MCHC: 32.9 g/dL (ref 30.0–36.0)
MCV: 79.6 fL (ref 78.0–100.0)
Platelets: 314 10*3/uL (ref 150–400)
RBC: 3.78 MIL/uL — ABNORMAL LOW (ref 4.22–5.81)
RDW: 15.5 % (ref 11.5–15.5)
WBC: 15.3 10*3/uL — AB (ref 4.0–10.5)

## 2017-05-07 LAB — BASIC METABOLIC PANEL
ANION GAP: 9 (ref 5–15)
BUN: 8 mg/dL (ref 6–20)
CHLORIDE: 99 mmol/L — AB (ref 101–111)
CO2: 24 mmol/L (ref 22–32)
Calcium: 8.7 mg/dL — ABNORMAL LOW (ref 8.9–10.3)
Creatinine, Ser: 0.86 mg/dL (ref 0.61–1.24)
GFR calc non Af Amer: >60 mL/min (ref >60)
Glucose, Bld: 107 mg/dL — ABNORMAL HIGH (ref 65–99)
POTASSIUM: 3.9 mmol/L (ref 3.5–5.1)
SODIUM: 132 mmol/L — AB (ref 135–145)

## 2017-05-07 LAB — PROTIME-INR
INR: 1.13
Prothrombin Time: 14.4 seconds (ref 11.4–15.2)

## 2017-05-07 LAB — MAGNESIUM: MAGNESIUM: 1.9 mg/dL (ref 1.7–2.4)

## 2017-05-07 MED ORDER — MORPHINE SULFATE (PF) 2 MG/ML IV SOLN
2.0000 mg | INTRAVENOUS | Status: DC | PRN
  Administered 2017-05-07 – 2017-05-11 (×28): 2 mg via INTRAVENOUS
  Filled 2017-05-07 (×28): qty 1

## 2017-05-07 NOTE — Progress Notes (Signed)
PULMONARY / CRITICAL CARE MEDICINE   Name: Joel Mathis MRN: [id number] DOB: 07/25/1980    ADMISSION DATE:  05/03/2017   REFERRING MD: Emergency department physician  CHIEF COMPLAINT: Lingual bleeding  HISTORY OF PRESENT ILLNESS:   [name] who while in prison had left ear pain.  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  He presented to the intensive care unit post interventional radiology intervention for embolization of lingual bleeding.  Subsequently had other lingual bleeding copious amounts, repeat embolization.  SUBJECTIVE:  No evidence of further bleeding He is to have a court tract to place He is to go to stepdown unit and to [facility].   VITAL SIGNS: BP (!) 186/93 (BP Location: Left Arm)   Pulse (!) 109   Temp 99 F (37.2 C) (Axillary)   Resp 14   Ht 6\' 5"  (1.956 m)   Wt 216 lb 7.9 oz (98.2 kg)   SpO2 98%   BMI 25.67 kg/m   HEMODYNAMICS:    VENTILATOR SETTINGS:    INTAKE / OUTPUT: I/O last 3 completed shifts: In: 1040 [I.V.:590; IV Piggyback:450] Out: 7829 [Urine:3750]  PHYSICAL EXAMINATION: General: Well-nourished well-developed male HEENT: Tongue does not have any areas of bleeding at this time.  Severe halitosis PSY: Dull effect  neuro: Intact CV: Irregular regular rate and rhythm PULM: Clear to auscultation FA:OZHY, non-tender, bsx4 active  Extremities: warm/dry, negative edema  Skin: no rashes or lesions   LABS:  BMET Recent Labs  Lab 05/05/17 0435 05/06/17 0442 05/07/17 0401  NA 134* 131* 132*  K 3.3* 4.1 3.9  CL 107 101 99*  CO2 20* 21* 24  BUN 6 6 8   CREATININE 0.89 0.87 0.86  GLUCOSE 109* 114* 107*   Electrolytes Recent Labs  Lab 05/04/17 0409 05/05/17 0435 05/06/17 0442 05/07/17 0401  CALCIUM 7.6* 7.9* 8.3* 8.7*  MG 1.7 1.9 2.0 1.9  PHOS 2.0* 1.9* 2.4*  --    CBC Recent Labs  Lab 05/05/17 0435 05/06/17 0442  05/07/17 0401  WBC 15.3* 18.9* 15.3*  HGB 9.5* 9.8* 9.9*  HCT 29.0* 30.6* 30.1*  PLT 211 247 314   Coag's Recent Labs  Lab 05/03/17 0839 05/03/17 1344 05/03/17 2250 05/07/17 0401  APTT 23* 29  --   --   INR 1.22 1.16 1.15 1.13   Sepsis Markers Recent Labs  Lab 05/03/17 0812 05/03/17 1344 05/03/17 2328 05/05/17 1153 05/06/17 0442  LATICACIDVEN 1.51 0.9 0.8  --   --   PROCALCITON  --  0.34  --  0.18 0.31   ABG Recent Labs  Lab 05/03/17 1350 05/04/17 0409 05/05/17 0340  PHART 7.374 7.347* 7.383  PCO2ART 33.1 34.2 35.2  PO2ART 456.0* 175.0* 160*   Liver Enzymes Recent Labs  Lab 05/03/17 1344  AST 15  ALT 29  ALKPHOS 50  BILITOT 0.7  ALBUMIN 2.7*   Cardiac Enzymes No results for input(s): TROPONINI, PROBNP in the last 168 hours.  Glucose Recent Labs  Lab 05/05/17 0019 05/06/17 0734 05/06/17 1215 05/06/17 1738 05/06/17 1911 05/06/17 2304  GLUCAP 79 119* 103* 91 105* 98   Imaging No results found. STUDIES:    CULTURES: Blood 12/9 >>  Urine 12/9 >>   ANTIBIOTICS: Cefepime 12/9 >> 12/10 unasyn 12/10 >>   SIGNIFICANT EVENTS: 05/03/2017 intubated for interventional radiology procedure of embolization of lingual bleed.  His airway was difficult secondary to copious bleeding 05/03/2017 after coming to  the intensive care unit becoming hypertensive.  Blood pressure 170 he had copious lingual arterial bleeding.  He was sedated pressure was applied trauma cath was inserted interventional radiology has been contacted and he is to return for further interventions.  LINES/TUBES: 05/03/2017 endotracheal tube>> 12/9 DISCUSSION: 36 year old male who had resection of left squamous cell lingual cancer at [facility] approximately 2 weeks ago.  He noted left ear pain while he was in prison. He presented to the [facility] with copious lingual bleeding.  He was taken to interventional radiology and had embolization of the lingual artery.  He remains  intubated at this time is noted to have a mass in the airway.  Extubated 12/9  ASSESSMENT / PLAN:  PULMONARY A: Vent dependent respiratory failure secondary to lingual bleeding in the setting of recent squamous cell resection of left lingual area. According anesthesia his airway did show evidence of a mass.  But difficulty of intubation secondary to bloody view rather than anatomy of the airway.  P:   Tolerated extubation Post pulmonary hygiene Failed swallowing evaluation 05/06/2017 Likely will require ENT follow-up Currently off oxygen He will need core tract feeding tube  05/07/2017 no requires intensive care.  We will transferred to stepdown unit and to [facility] as of 05/08/2017  CARDIOVASCULAR A:  No known history of cardiovascular disease in a 36 year old. HTN  undiagnosed P:  Clonidine patch in place Metoprolol IV as needed Hydralazine IV as needed  RENAL Lab Results  Component Value Date   CREATININE 0.86 05/07/2017   CREATININE 0.87 05/06/2017   CREATININE 0.89 05/05/2017   Recent Labs  Lab 05/05/17 0435 05/06/17 0442 05/07/17 0401  K 3.3* 4.1 3.9    A:   Mild renal insufficiency, resolved P:   Follow urine output, BMP Replace electrolytes as indicated Avoid nephrotoxins and ensure adequate renal perfusion   GASTROINTESTINAL A:   Currently n.p.o. Questionable ability to eat with recent head neck cancer resection Failed swallowing evaluation 05/06/2017 P:   Do not anticipate that he will be able to take p.o., we will place.  To facilitate tube feeding and medications Core tract team has been contacted await placement of core tract to 05/07/2017  HEMATOLOGIC Recent Labs    05/06/17 0442 05/07/17 0401  HGB 9.8* 9.9*   Lab Results  Component Value Date   INR 1.13 05/07/2017   INR 1.15 05/03/2017   INR 1.16 05/03/2017     A:   Lingual bleeding post interventional radiology  Embolization, not actively bleeding P:  Follow CBC and  INR Status post embolization x2 with no further breathing on 05/08/1999  INFECTIOUS A:   Recent left lingual resection approximately 2 weeks ago at [facility] for squamous cell carcinoma. Febrile + leukocytosis 12/9, consider HCAP, consider oral pharyngeal infection P:   Given potential oropharyngeal source either locally or pulmonary, will change cefepime to unasyn to cover anaerobes.  Follow culture data, obtain respiratory culture with possible Follow chest x-ray as needed  ENDOCRINE A:   No acute issues P:   Follow glucose on BMP  NEUROLOGIC A:   Extubated Pain control secondary to recent resection of left lingual area P:   RASS goal: 0 As needed pain control with narcotics, hold all sedating medications  FAMILY  - Updates: No family bedside to update  -05/07/2017.  He no longer requires intensive care treatment.  His airway is patent.  Unable to eat.  He will need a quarter feeding tube placed.  Plan is to transfer him to the stepdown unit and to [facility] service as of 05/08/2017  Time at the bedside per S Minor ACNP 31 minutes  Richardson Landry Minor ACNP Maryanna Shape PCCM Pager [phone] till 1 pm If no answer page [phone] 05/07/2017, 8:12 AM

## 2017-05-08 ENCOUNTER — Inpatient Hospital Stay (HOSPITAL_COMMUNITY): Payer: Medicaid Other

## 2017-05-08 ENCOUNTER — Encounter (HOSPITAL_COMMUNITY): Payer: Self-pay | Admitting: Interventional Radiology

## 2017-05-08 LAB — BASIC METABOLIC PANEL
ANION GAP: 9 (ref 5–15)
BUN: 10 mg/dL (ref 6–20)
CALCIUM: 8.5 mg/dL — AB (ref 8.9–10.3)
CO2: 23 mmol/L (ref 22–32)
Chloride: 100 mmol/L — ABNORMAL LOW (ref 101–111)
Creatinine, Ser: 0.88 mg/dL (ref 0.61–1.24)
GFR calc Af Amer: >60 mL/min (ref >60)
GLUCOSE: 94 mg/dL (ref 65–99)
Potassium: 3.5 mmol/L (ref 3.5–5.1)
Sodium: 132 mmol/L — ABNORMAL LOW (ref 135–145)

## 2017-05-08 LAB — MAGNESIUM: Magnesium: 2 mg/dL (ref 1.7–2.4)

## 2017-05-08 LAB — GLUCOSE, CAPILLARY: GLUCOSE-CAPILLARY: 110 mg/dL — AB (ref 65–99)

## 2017-05-08 LAB — PHOSPHORUS: Phosphorus: 2.7 mg/dL (ref 2.5–4.6)

## 2017-05-08 MED ORDER — DEXTROSE-NACL 5-0.9 % IV SOLN
INTRAVENOUS | Status: DC
  Administered 2017-05-08 – 2017-05-11 (×8): via INTRAVENOUS

## 2017-05-08 MED ORDER — ENOXAPARIN SODIUM 40 MG/0.4ML ~~LOC~~ SOLN
40.0000 mg | SUBCUTANEOUS | Status: DC
  Administered 2017-05-09 – 2017-05-11 (×3): 40 mg via SUBCUTANEOUS
  Filled 2017-05-08 (×3): qty 0.4

## 2017-05-08 NOTE — Progress Notes (Signed)
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 Pt is s/p lingual artery embolization X2 with resolved bleeding. Pt breathing has been stable since extubation. Pt has failed swallow evaluation and placement of Cortrak feeding tube has been recommended of which patient is refusing. Pt is on IV Unasyn for aspiration PNA coverage.   Today, pt denies any new complaints, denies any chest pain, SOB, abdominal pain, N/V/D/C, fever/chills.  Assessment/Plan: Active Problems:   History of oral surgery   Epistaxis   Anemia   Acute post-hemorrhagic anemia   Hemorrhagic shock (HCC)   Acute respiratory failure with hypoxia (HCC)  #Acute respiratory failure secondary to lingual bleeding in the setting of recent squamous cell resection of left lingual area Resolved, currently off O2 S/P intubation on 12/7 and extubated on 12/9, stable Failed swallowing evaluation 05/06/2017, recommended Cortrak of which pt adamantly refused despite education, will retry on 05/10/17 Likely will require ENT follow-up due to mass noted in airway during intubation Pulmonary hygeine Continue NPO, IVF D5/NS Monitor closely  #Lingual bleeding post interventional radiology embolization of lingual artery Resolved Status post embolization x2 with no further breathing since 05/07/17 Daily CBC  #SIRS, no known source Recent left lingual resection approximately 2 weeks ago at [facility] for squamous cell carcinoma. Last temp 101.5 on 12/11, resolving leukocytosis 12/9 Consider oral pharyngeal infection vs aspiration PNA Vs HCAP BC no growth till date Procalcitonin 0.18-->0.31 CXR showed minimal left basilar subsegmental  atelectasis Continue IV unasyn to cover anaerobes  If temp spike, please draw BC Monitor closely  #HTN Controlled ?? Undiagnosed Vs setting of acute pain Clonidine patch in place IV Metoprolol prn IV hydralazine prn  #AKI Resolved    Code Status: Full   Family Communication: None at bedside  Disposition Plan: Once stable   Consultants:  PCCM  Procedures: Embolization of lingual artery X 2  Antimicrobials:  IV Unasyn  DVT prophylaxis:  [place] Lovenox   Objective: Vitals:   05/07/17 2146 05/08/17 0307 05/08/17 0613 05/08/17 1353  BP: 121/62 121/62 116/61 110/69  Pulse: (!) 122 (!) 121 (!) 116 (!) 107  Resp: '18  18 18  ' Temp: 100.2 F (37.9 C)  98.5 F (36.9 C) 98.9 F (37.2 C)  TempSrc: Oral  Oral Oral  SpO2: 99%  96% 100%  Weight:   96.9 kg (213 lb 10 oz)   Height:        Intake/Output Summary (Last 24 hours) at 05/08/2017 1651 Last data filed at 05/08/2017 1626 Gross per 24 hour  Intake 1290 ml  Output 1025 ml  Net 265 ml   Filed Weights   05/06/17 0415 05/07/17 0349 05/08/17 0613  Weight: 101.3 kg (223 lb 5.2 oz) 98.2 kg (216 lb 7.9 oz) 96.9 kg (213 lb 10 oz)    Exam:   General:  Alert, awake, oriented, unable to speak due to lingula CA. No bleeding, severe halithosis  Cardiovascular: S1-S2 present, no added hrt sounds  Respiratory: Clear to auscultation b/l  Abdomen: Soft, non-tender, non-distended, BS present  Musculoskeletal: No pedal edema  Skin: Normal  Psychiatry: Normal   Data Reviewed: CBC: Recent Labs  Lab 05/03/17 0505  05/03/17 2250 05/04/17 0409 05/05/17 0435 05/06/17 0442 05/07/17 0401  WBC  9.5   < > 10.1 11.4* 15.3* 18.9* 15.3*  NEUTROABS 4.3  --   --  9.3*  --   --   --   HGB 11.4*   < > 9.4* 9.6* 9.5* 9.8* 9.9*  HCT 35.7*   < > 27.8* 28.3* 29.0* 30.6* 30.1*  MCV 73.3*   < > 77.0* 77.1* 78.8 80.1 79.6  PLT 421*   < > 183 184 211 247 314   < > = values in this interval not displayed.   Basic Metabolic  Panel: Recent Labs  Lab 05/03/17 1344 05/04/17 0409 05/05/17 0435 05/06/17 0442 05/07/17 0401 05/08/17 0429  NA 137 135 134* 131* 132* 132*  K 5.0 3.5 3.3* 4.1 3.9 3.5  CL 114* 109 107 101 99* 100*  CO2 18* 20* 20* 21* 24 23  GLUCOSE 140* 99 109* 114* 107* 94  BUN '13 9 6 6 8 10  ' CREATININE 0.92 0.93 0.89 0.87 0.86 0.88  CALCIUM 7.2* 7.6* 7.9* 8.3* 8.7* 8.5*  MG 1.7 1.7 1.9 2.0 1.9 2.0  PHOS 2.1* 2.0* 1.9* 2.4*  --  2.7   GFR: Estimated Creatinine Clearance: 146.3 mL/min (by C-G formula based on SCr of 0.88 mg/dL). Liver Function Tests: Recent Labs  Lab 05/03/17 1344  AST 15  ALT 29  ALKPHOS 50  BILITOT 0.7  PROT 4.7*  ALBUMIN 2.7*   No results for input(s): LIPASE, AMYLASE in the last 168 hours. No results for input(s): AMMONIA in the last 168 hours. Coagulation Profile: Recent Labs  Lab 05/03/17 0839 05/03/17 1344 05/03/17 2250 05/07/17 0401  INR 1.22 1.16 1.15 1.13   Cardiac Enzymes: No results for input(s): CKTOTAL, CKMB, CKMBINDEX, TROPONINI in the last 168 hours. BNP (last 3 results) No results for input(s): PROBNP in the last 8760 hours. HbA1C: No results for input(s): HGBA1C in the last 72 hours. CBG: Recent Labs  Lab 05/06/17 1215 05/06/17 1738 05/06/17 1911 05/06/17 2304 05/07/17 0338  GLUCAP 103* 91 105* 98 110*   Lipid Profile: No results for input(s): CHOL, HDL, LDLCALC, TRIG, CHOLHDL, LDLDIRECT in the last 72 hours. Thyroid Function Tests: No results for input(s): TSH, T4TOTAL, FREET4, T3FREE, THYROIDAB in the last 72 hours. Anemia Panel: No results for input(s): VITAMINB12, FOLATE, FERRITIN, TIBC, IRON, RETICCTPCT in the last 72 hours. Urine analysis:    Component Value Date/Time   COLORURINE YELLOW 05/05/2017 1152   APPEARANCEUR CLEAR 05/05/2017 1152   LABSPEC 1.015 05/05/2017 1152   PHURINE 5.0 05/05/2017 1152   GLUCOSEU NEGATIVE 05/05/2017 1152   HGBUR SMALL (A) 05/05/2017 1152   BILIRUBINUR NEGATIVE 05/05/2017 1152    KETONESUR 20 (A) 05/05/2017 1152   PROTEINUR NEGATIVE 05/05/2017 1152   NITRITE NEGATIVE 05/05/2017 1152   LEUKOCYTESUR NEGATIVE 05/05/2017 1152   Sepsis Labs: '@LABRCNTIP' (procalcitonin:4,lacticidven:4)  ) Recent Results (from the past 240 hour(s))  MRSA PCR Screening     Status: None   Collection Time: 05/04/17  1:57 AM  Result Value Ref Range Status   MRSA by PCR NEGATIVE NEGATIVE Final    Comment:        The GeneXpert MRSA Assay (FDA approved for NASAL specimens only), is one component of a comprehensive MRSA colonization surveillance program. It is not intended to diagnose MRSA infection nor to guide or monitor treatment for MRSA infections.   Culture, blood (Routine X 2) w Reflex to ID Panel     Status: None (Preliminary result)   Collection Time: 05/05/17  1:27 PM  Result Value Ref Range  Status   Specimen Description BLOOD  Final   Special Requests   Final    IN BOTH AEROBIC AND ANAEROBIC BOTTLES Blood Culture adequate volume   Culture NO GROWTH 3 DAYS  Final   Report Status PENDING  Incomplete  Culture, blood (Routine X 2) w Reflex to ID Panel     Status: None (Preliminary result)   Collection Time: 05/05/17  6:15 PM  Result Value Ref Range Status   Specimen Description BLOOD LEFT HAND  Final   Special Requests IN PEDIATRIC BOTTLE Blood Culture adequate volume  Final   Culture NO GROWTH 3 DAYS  Final   Report Status PENDING  Incomplete      Studies: Dg Chest Port 1 View  Result Date: 05/08/2017 CLINICAL DATA:  Shortness of breath. EXAM: PORTABLE CHEST 1 VIEW COMPARISON:  Radiograph of May 05, 2017. FINDINGS: Stable cardiomediastinal silhouette. No pneumothorax or pleural effusion is noted. Right lung is clear. Endotracheal tube has been removed. Minimal left basilar subsegmental atelectasis is noted. Bony thorax is unremarkable. IMPRESSION: Minimal left basilar subsegmental atelectasis. Electronically Signed   By: Marijo Conception, M.D.   On: 05/08/2017 07:58      Scheduled Meds: . chlorhexidine gluconate (MEDLINE KIT)  15 mL Mouth Rinse BID  . cloNIDine  0.2 mg Transdermal Weekly  . mouth rinse  15 mL Mouth Rinse q12n4p    Continuous Infusions: . sodium chloride 250 mL (05/05/17 0601)  . ampicillin-sulbactam (UNASYN) IV 3 g (05/08/17 1626)  . dextrose 5 % and 0.9% NaCl 100 mL/hr at 05/08/17 1106     LOS: 5 days     Alma Friendly, MD [facility]   If 7PM-7AM, please contact night-coverage [email or web address] Password [facility] 05/08/2017, 4:51 PM

## 2017-05-08 NOTE — Plan of Care (Signed)
  Progressing Activity: Risk for activity intolerance will decrease 05/08/2017 1128 - Progressing by Marice Potter, RN Coping: Level of anxiety will decrease 05/08/2017 1128 - Progressing by Marice Potter, RN Pain Managment: General experience of comfort will improve 05/08/2017 1128 - Progressing by Marice Potter, RN   Not Progressing Nutrition: Adequate nutrition will be maintained 05/08/2017 1128 - Not Progressing by Marice Potter, RN

## 2017-05-08 NOTE — Progress Notes (Signed)
Nutrition Follow-up  DOCUMENTATION CODES:   Not applicable  INTERVENTION:  When tube is placed, recommend Jevity 1.2 at 86mL/hr increase by 10 every 8 hours to goal rate of 48mL/hr Pro-stat 81mL TID At goal, Provides 2604 calories, 152gm protein, 1542mL free water  Free water per MD  NUTRITION DIAGNOSIS:   Inadequate oral intake related to inability to eat as evidenced by NPO status. 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 Presented with profuse acute bleeding from mouth w/ bloody emesis. Underwent superselective embolization in am of 12/7. Had recurrent bleeding and underwent repeat embolization in pm of 12/7. Remains intubated post-procedure.    Extubated 12/9  Patient refused cortrak tube placement today. Unable to eat per CCM and radiology. MD plans to discuss with patient again. Per Speech pathology not patient believes he is able to swallow liquids but they have been passively spilling from his oral cavity. Will continue to follow.  Labs reviewed:  Na 132 Medications reviewed and include:  D5 NS at 15mL/hr  Diet Order:  Diet NPO time specified  EDUCATION NEEDS:   No education needs have been identified at this time  Skin:  Skin Assessment: Reviewed RN Assessment  Last BM:  Unknown  Height:   Ht Readings from Last 1 Encounters:  05/03/17 6\' 5"  (1.956 m)    Weight:   Wt Readings from Last 1 Encounters:  05/08/17 213 lb 10 oz (96.9 kg)    Ideal Body Weight:  94.55 kg  BMI:  Body mass index is 25.33 kg/m.  Estimated Nutritional Needs:   Kcal:  2400-2700 calories  Protein:  143-163g Pro (1.4-1.6 g/kg bw)  Fluid:  2.4-2.7L  Satira Anis. Ellie Bryand, MS, RD LDN Inpatient Clinical  Dietitian Pager [phone]

## 2017-05-08 NOTE — Progress Notes (Signed)
Cortrak Tube Team Note:  Consult received to place a Cortrak feeding tube.  MD had discussed with pt this AM at which time pt was okay with tube placement. RD arrived to the floor and RN and RD entered room; RN offered pain medication for pt prior to tube placement. Pt adamantly refused tube at that time (see RN note at 2:33 PM). MD was paged by RN and talked with pt at bedside. Pt again adamantly refused Cortrak tube placement. MD states plan to discuss again tomorrow; she is aware that Cortrak service is not available on Thursdays and that RN can place NGT at bedside.   Cortrak service is available: Monday, Wednesday, Friday, and Saturday.      Jarome Matin, MS, RD, LDN, [facility] Inpatient Clinical Dietitian Pager # [phone] After hours/weekend pager # [phone]

## 2017-05-08 NOTE — Progress Notes (Signed)
Pt refusing small bore tube placement. Says wants another swallow eval. MD notified. Awaiting response

## 2017-05-09 LAB — BASIC METABOLIC PANEL
ANION GAP: 12 (ref 5–15)
BUN: 8 mg/dL (ref 6–20)
CALCIUM: 8.9 mg/dL (ref 8.9–10.3)
CHLORIDE: 102 mmol/L (ref 101–111)
CO2: 23 mmol/L (ref 22–32)
Creatinine, Ser: 0.91 mg/dL (ref 0.61–1.24)
GFR calc Af Amer: >60 mL/min (ref >60)
GFR calc non Af Amer: >60 mL/min (ref >60)
GLUCOSE: 115 mg/dL — AB (ref 65–99)
Potassium: 3.5 mmol/L (ref 3.5–5.1)
Sodium: 137 mmol/L (ref 135–145)

## 2017-05-09 LAB — CBC WITH DIFFERENTIAL/PLATELET
BASOS ABS: 0 10*3/uL (ref 0.0–0.1)
Basophils Relative: 0 %
Eosinophils Absolute: 0.2 10*3/uL (ref 0.0–0.7)
Eosinophils Relative: 2 %
HEMATOCRIT: 28.1 % — AB (ref 39.0–52.0)
HEMOGLOBIN: 9.1 g/dL — AB (ref 13.0–17.0)
LYMPHS PCT: 17 %
Lymphs Abs: 1.5 10*3/uL (ref 0.7–4.0)
MCH: 25.7 pg — ABNORMAL LOW (ref 26.0–34.0)
MCHC: 32.4 g/dL (ref 30.0–36.0)
MCV: 79.4 fL (ref 78.0–100.0)
MONO ABS: 1 10*3/uL (ref 0.1–1.0)
MONOS PCT: 11 %
NEUTROS ABS: 6.2 10*3/uL (ref 1.7–7.7)
NEUTROS PCT: 70 %
Platelets: 392 10*3/uL (ref 150–400)
RBC: 3.54 MIL/uL — ABNORMAL LOW (ref 4.22–5.81)
RDW: 15.2 % (ref 11.5–15.5)
WBC: 8.9 10*3/uL (ref 4.0–10.5)

## 2017-05-09 MED ORDER — LIDOCAINE HCL 2 % EX GEL
1.0000 "application " | Freq: Once | CUTANEOUS | Status: DC | PRN
  Filled 2017-05-09: qty 5

## 2017-05-09 MED ORDER — SILVER NITRATE-POT NITRATE 75-25 % EX MISC
1.0000 | Freq: Once | CUTANEOUS | Status: DC | PRN
  Filled 2017-05-09: qty 1

## 2017-05-09 MED ORDER — LIDOCAINE-EPINEPHRINE (PF) 1 %-1:200000 IJ SOLN
0.0000 mL | Freq: Once | INTRAMUSCULAR | Status: DC | PRN
  Filled 2017-05-09: qty 30

## 2017-05-09 MED ORDER — OXYMETAZOLINE HCL 0.05 % NA SOLN
1.0000 | Freq: Once | NASAL | Status: DC | PRN
  Filled 2017-05-09: qty 15

## 2017-05-09 MED ORDER — TRIPLE ANTIBIOTIC 3.5-400-5000 EX OINT
1.0000 "application " | TOPICAL_OINTMENT | Freq: Once | CUTANEOUS | Status: DC | PRN
  Filled 2017-05-09: qty 1

## 2017-05-09 MED ORDER — LIDOCAINE HCL 4 % EX SOLN
0.0000 mL | Freq: Once | CUTANEOUS | Status: DC | PRN
  Filled 2017-05-09: qty 50

## 2017-05-09 NOTE — Progress Notes (Signed)
Upon hanging 2200 dose of unasyn, it was noticed by this nurse that the 1600 dose of unasyn and not been unclamped when started, so the patient did not receive the dose.  Charge RN made aware.  2200 dose started as scheduled.

## 2017-05-09 NOTE — Progress Notes (Signed)
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 Pt is s/p lingual artery embolization X2 with resolved bleeding. Pt breathing has been stable since extubation. Pt has failed swallow evaluation and placement of Cortrak feeding tube has been recommended of which patient is refusing. Pt is on IV Unasyn for aspiration PNA coverage.   Today, pt is still not able to swallow, still c/o oral pain. Denies any chest pain, SOB, abdominal pain, N/V/D/C, fever/chills. Pt is in no resp distress, continues to sat well on RA. Plan is to transfer to [facility], pt already accepted.  Assessment/Plan: Active Problems:   History of oral surgery   Epistaxis   Anemia   Acute post-hemorrhagic anemia   Hemorrhagic shock (HCC)   Acute respiratory failure with hypoxia (HCC)  #Acute respiratory failure secondary to lingual bleeding in the setting of recent squamous cell resection of left lingual area Resolved, currently off O2 S/P intubation on 12/7 and extubated on 12/9, stable Failed swallowing evaluation 05/06/2017, recommended Cortrak of which pt adamantly refused despite education ENT consulted due to questionable mass noted in airway during intubation. Flexible fiberoptic laryngoscopy done, with no obvious mass identified. Swelling, trismus, pain and lack of cooperation limited the exam Spoke extensively to Dr Rodman Pickle (accepting physician) and RN to Dr Dayton Callas in [place] who both awaiting patient transfer to [facility]. RN to Dr Palmyra Callas (who performed intial surgery) [place] is [phone] Awaiting bed availability for transfer to [place]. Pt already accepted Pulmonary hygeine Continue NPO, IVF  D5/NS Monitor closely  #Lingual bleeding post interventional radiology embolization of lingual artery Resolved Status post embolization x2 with no further breathing since 05/07/17 Daily CBC  #SIRS, no known source Recent left lingual resection approximately 2 weeks ago at [facility] for squamous cell carcinoma. Last temp 101.5 on 12/11, resolved leukocytosis Consider oral pharyngeal infection vs aspiration PNA Vs HCAP BC no growth till date Procalcitonin 0.18-->0.31 CXR showed minimal left basilar subsegmental atelectasis Continue IV unasyn to cover anaerobes  If temp spike, please draw BC Monitor closely  #HTN Controlled ?? Undiagnosed Vs setting of acute pain Clonidine patch in place IV Metoprolol prn IV hydralazine prn  #AKI Resolved    Code Status: Full   Family Communication: None at bedside  Disposition Plan: Once stable   Consultants:  PCCM  ENT  Procedures: Embolization of lingual artery X 2  Antimicrobials:  IV Unasyn  DVT prophylaxis:  [place] Lovenox   Objective: Vitals:   05/08/17 1353 05/08/17 2316 05/09/17 0601 05/09/17 1411  BP: 110/69 120/67 117/65 (!) 110/59  Pulse: (!) 107 (!) 112 (!) 106 (!) 108  Resp: 18 18 18 18   Temp: 98.9 F (37.2 C) 99.8 F (37.7 C) 98.9 F (37.2 C) 98.3 F (36.8 C)  TempSrc: Oral Axillary Axillary Axillary  SpO2: 100% 99% 98% 98%  Weight:      Height:        Intake/Output Summary (Last 24 hours) at 05/09/2017 1947 Last data filed at 05/09/2017 1800 Gross per 24 hour  Intake 3056.67 ml  Output 600 ml  Net 2456.67 ml   Filed Weights   05/06/17 0415 05/07/17 0349 05/08/17 0613  Weight: 101.3 kg (223 lb 5.2 oz) 98.2  kg (216 lb 7.9 oz) 96.9 kg (213 lb 10 oz)    Exam:   General:  Alert, awake, oriented, unable to speak due to lingula CA. No bleeding, severe halithosis  Cardiovascular: S1-S2 present, no added hrt sounds  Respiratory: Clear to auscultation b/l  Abdomen: Soft, non-tender,  non-distended, BS present  Musculoskeletal: No pedal edema  Skin: Normal  Psychiatry: Normal   Data Reviewed: CBC: Recent Labs  Lab 05/03/17 0505  05/04/17 0409 05/05/17 0435 05/06/17 0442 05/07/17 0401 05/09/17 0536  WBC 9.5   < > 11.4* 15.3* 18.9* 15.3* 8.9  NEUTROABS 4.3  --  9.3*  --   --   --  6.2  HGB 11.4*   < > 9.6* 9.5* 9.8* 9.9* 9.1*  HCT 35.7*   < > 28.3* 29.0* 30.6* 30.1* 28.1*  MCV 73.3*   < > 77.1* 78.8 80.1 79.6 79.4  PLT 421*   < > 184 211 247 314 392   < > = values in this interval not displayed.   Basic Metabolic Panel: Recent Labs  Lab 05/03/17 1344 05/04/17 0409 05/05/17 0435 05/06/17 0442 05/07/17 0401 05/08/17 0429 05/09/17 0536  NA 137 135 134* 131* 132* 132* 137  K 5.0 3.5 3.3* 4.1 3.9 3.5 3.5  CL 114* 109 107 101 99* 100* 102  CO2 18* 20* 20* 21* 24 23 23   GLUCOSE 140* 99 109* 114* 107* 94 115*  BUN 13 9 6 6 8 10 8   CREATININE 0.92 0.93 0.89 0.87 0.86 0.88 0.91  CALCIUM 7.2* 7.6* 7.9* 8.3* 8.7* 8.5* 8.9  MG 1.7 1.7 1.9 2.0 1.9 2.0  --   PHOS 2.1* 2.0* 1.9* 2.4*  --  2.7  --    GFR: Estimated Creatinine Clearance: 141.4 mL/min (by C-G formula based on SCr of 0.91 mg/dL). Liver Function Tests: Recent Labs  Lab 05/03/17 1344  AST 15  ALT 29  ALKPHOS 50  BILITOT 0.7  PROT 4.7*  ALBUMIN 2.7*   No results for input(s): LIPASE, AMYLASE in the last 168 hours. No results for input(s): AMMONIA in the last 168 hours. Coagulation Profile: Recent Labs  Lab 05/03/17 0839 05/03/17 1344 05/03/17 2250 05/07/17 0401  INR 1.22 1.16 1.15 1.13   Cardiac Enzymes: No results for input(s): CKTOTAL, CKMB, CKMBINDEX, TROPONINI in the last 168 hours. BNP (last 3 results) No results for input(s): PROBNP in the last 8760 hours. HbA1C: No results for input(s): HGBA1C in the last 72 hours. CBG: Recent Labs  Lab 05/06/17 1215 05/06/17 1738 05/06/17 1911 05/06/17 2304 05/07/17 0338  GLUCAP 103* 91 105* 98 110*   Lipid Profile: No  results for input(s): CHOL, HDL, LDLCALC, TRIG, CHOLHDL, LDLDIRECT in the last 72 hours. Thyroid Function Tests: No results for input(s): TSH, T4TOTAL, FREET4, T3FREE, THYROIDAB in the last 72 hours. Anemia Panel: No results for input(s): VITAMINB12, FOLATE, FERRITIN, TIBC, IRON, RETICCTPCT in the last 72 hours. Urine analysis:    Component Value Date/Time   COLORURINE YELLOW 05/05/2017 [place] 05/05/2017 1152   LABSPEC 1.015 05/05/2017 1152   PHURINE 5.0 05/05/2017 1152   GLUCOSEU NEGATIVE 05/05/2017 1152   HGBUR SMALL (A) 05/05/2017 1152   BILIRUBINUR NEGATIVE 05/05/2017 1152   KETONESUR 20 (A) 05/05/2017 1152   PROTEINUR NEGATIVE 05/05/2017 1152   NITRITE NEGATIVE 05/05/2017 1152   LEUKOCYTESUR NEGATIVE 05/05/2017 1152   Sepsis Labs: @LABRCNTIP (procalcitonin:4,lacticidven:4)  ) Recent Results (from the past 240 hour(s))  MRSA PCR Screening     Status: None  Collection Time: 05/04/17  1:57 AM  Result Value Ref Range Status   MRSA by PCR NEGATIVE NEGATIVE Final    Comment:        The GeneXpert MRSA Assay (FDA approved for NASAL specimens only), is one component of a comprehensive MRSA colonization surveillance program. It is not intended to diagnose MRSA infection nor to guide or monitor treatment for MRSA infections.   Culture, blood (Routine X 2) w Reflex to ID Panel     Status: None (Preliminary result)   Collection Time: 05/05/17  1:27 PM  Result Value Ref Range Status   Specimen Description BLOOD  Final   Special Requests   Final    IN BOTH AEROBIC AND ANAEROBIC BOTTLES Blood Culture adequate volume   Culture NO GROWTH 4 DAYS  Final   Report Status PENDING  Incomplete  Culture, blood (Routine X 2) w Reflex to ID Panel     Status: None (Preliminary result)   Collection Time: 05/05/17  6:15 PM  Result Value Ref Range Status   Specimen Description BLOOD LEFT HAND  Final   Special Requests IN PEDIATRIC BOTTLE Blood Culture adequate volume   Final   Culture NO GROWTH 4 DAYS  Final   Report Status PENDING  Incomplete      Studies: No results found.  Scheduled Meds: . chlorhexidine  15 mL Mouth Rinse BID  . cloNIDine  0.2 mg Transdermal Weekly  . enoxaparin (LOVENOX) injection  40 mg Subcutaneous Q24H  . mouth rinse  15 mL Mouth Rinse q12n4p    Continuous Infusions: . sodium chloride Stopped (05/09/17 0440)  . ampicillin-sulbactam (UNASYN) IV Stopped (05/09/17 1713)  . dextrose 5 % and 0.9% NaCl 100 mL/hr at 05/09/17 0850     LOS: 6 days     Alma Friendly, MD [facility]   If 7PM-7AM, please contact night-coverage [email or web address] Password [facility] 05/09/2017, 7:47 PM

## 2017-05-09 NOTE — Consult Note (Signed)
Reason for Consult: Abnormal airway finding Referring Physician: Alma Friendly, MD  Joel Mathis is an 36 y.o. male.  HPI: Patient had a tongue carcinoma resected at [facility] about 2 weeks ago.  He was scheduled to return for outpatient follow-up this week but he started having significant bleeding from the surgical site and was admitted to ICU.  He underwent embolization.  The bleeding seems to be under control.  He was intubated for a couple of days and there was mention of a possible upper airway mass during the intubation.  I do not have details of the recent surgery.  There are no notes available on the epic system.  His surgeon was Dr. Espy Callas.  Of note, he has a past history of extensive cocaine use.  Past Medical History:  Diagnosis Date  . Tongue cancer [facility])     Past Surgical History:  Procedure Laterality Date  . IR ANGIO EXTERNAL CAROTID SEL EXT CAROTID BILAT MOD SED  05/03/2017  . IR ANGIO EXTERNAL CAROTID SEL EXT CAROTID BILAT MOD SED  05/03/2017  . IR ANGIO INTRA EXTRACRAN SEL COM CAROTID INNOMINATE BILAT MOD SED  05/03/2017  . IR ANGIO INTRA EXTRACRAN SEL COM CAROTID INNOMINATE BILAT MOD SED  05/03/2017  . IR ANGIO VERTEBRAL SEL VERTEBRAL BILAT MOD SED  05/03/2017  . IR ANGIOGRAM FOLLOW UP STUDY  05/03/2017  . IR ANGIOGRAM FOLLOW UP STUDY  05/03/2017  . IR NEURO EACH ADD'L AFTER BASIC UNI LEFT (MS)  05/03/2017  . IR NEURO EACH ADD'L AFTER BASIC UNI LEFT (MS)  05/03/2017  . IR NEURO EACH ADD'L AFTER BASIC UNI RIGHT (MS)  05/03/2017  . IR NEURO EACH ADD'L AFTER BASIC UNI RIGHT (MS)  05/03/2017  . IR TRANSCATH/EMBOLIZ  05/03/2017  . IR TRANSCATH/EMBOLIZ  05/03/2017  . RADIOLOGY WITH ANESTHESIA N/A 05/03/2017   Procedure: IR WITH ANESTHESIA, angio of embolism;  Surgeon: Luanne Bras, MD;  Location: [facility];  Service: Radiology;  Laterality: N/A;  . RADIOLOGY WITH ANESTHESIA N/A 05/03/2017   Procedure: IR WITH ANESTHESIA;  Surgeon: Luanne Bras, MD;  Location: [facility];   Service: Radiology;  Laterality: N/A;  . TONGUE SURGERY      History reviewed. No pertinent family history.  Social History:  reports that he has quit smoking. His smoking use included cigarettes. he has never used smokeless tobacco. He reports that he drinks alcohol. He reports that he does not use drugs.  Allergies: No Known Allergies  Medications: Reviewed  Results for orders placed or performed during the hospital encounter of 05/03/17 (from the past 48 hour(s))  Basic metabolic panel     Status: Abnormal   Collection Time: 05/08/17  4:29 AM  Result Value Ref Range   Sodium 132 (L) 135 - 145 mmol/L   Potassium 3.5 3.5 - 5.1 mmol/L   Chloride 100 (L) 101 - 111 mmol/L   CO2 23 22 - 32 mmol/L   Glucose, Bld 94 65 - 99 mg/dL   BUN 10 6 - 20 mg/dL   Creatinine, Ser 0.88 0.61 - 1.24 mg/dL   Calcium 8.5 (L) 8.9 - 10.3 mg/dL   GFR calc non Af Amer >60 >60 mL/min   GFR calc Af Amer >60 >60 mL/min    Comment: (NOTE) The eGFR has been calculated using the CKD EPI equation. This calculation has not been validated in all clinical situations. eGFR's persistently <60 mL/min signify possible Chronic Kidney Disease.    Anion gap 9 5 - 15  Magnesium     Status: None   Collection Time: 05/08/17  4:29 AM  Result Value Ref Range   Magnesium 2.0 1.7 - 2.4 mg/dL  Phosphorus     Status: None   Collection Time: 05/08/17  4:29 AM  Result Value Ref Range   Phosphorus 2.7 2.5 - 4.6 mg/dL  CBC with Differential/Platelet     Status: Abnormal   Collection Time: 05/09/17  5:36 AM  Result Value Ref Range   WBC 8.9 4.0 - 10.5 K/uL   RBC 3.54 (L) 4.22 - 5.81 MIL/uL   Hemoglobin 9.1 (L) 13.0 - 17.0 g/dL   HCT 28.1 (L) 39.0 - 52.0 %   MCV 79.4 78.0 - 100.0 fL   MCH 25.7 (L) 26.0 - 34.0 pg   MCHC 32.4 30.0 - 36.0 g/dL   RDW 15.2 11.5 - 15.5 %   Platelets 392 150 - 400 K/uL   Neutrophils Relative % 70 %   Neutro Abs 6.2 1.7 - 7.7 K/uL   Lymphocytes Relative 17 %   Lymphs Abs 1.5 0.7 - 4.0 K/uL    Monocytes Relative 11 %   Monocytes Absolute 1.0 0.1 - 1.0 K/uL   Eosinophils Relative 2 %   Eosinophils Absolute 0.2 0.0 - 0.7 K/uL   Basophils Relative 0 %   Basophils Absolute 0.0 0.0 - 0.1 K/uL  Basic metabolic panel     Status: Abnormal   Collection Time: 05/09/17  5:36 AM  Result Value Ref Range   Sodium 137 135 - 145 mmol/L   Potassium 3.5 3.5 - 5.1 mmol/L   Chloride 102 101 - 111 mmol/L   CO2 23 22 - 32 mmol/L   Glucose, Bld 115 (H) 65 - 99 mg/dL   BUN 8 6 - 20 mg/dL   Creatinine, Ser 0.91 0.61 - 1.24 mg/dL   Calcium 8.9 8.9 - 10.3 mg/dL   GFR calc non Af Amer >60 >60 mL/min   GFR calc Af Amer >60 >60 mL/min    Comment: (NOTE) The eGFR has been calculated using the CKD EPI equation. This calculation has not been validated in all clinical situations. eGFR's persistently <60 mL/min signify possible Chronic Kidney Disease.    Anion gap 12 5 - 15    Dg Chest Port 1 View  Result Date: 05/08/2017 CLINICAL DATA:  Shortness of breath. EXAM: PORTABLE CHEST 1 VIEW COMPARISON:  Radiograph of May 05, 2017. FINDINGS: Stable cardiomediastinal silhouette. No pneumothorax or pleural effusion is noted. Right lung is clear. Endotracheal tube has been removed. Minimal left basilar subsegmental atelectasis is noted. Bony thorax is unremarkable. IMPRESSION: Minimal left basilar subsegmental atelectasis. Electronically Signed   By: Marijo Conception, M.D.   On: 05/08/2017 07:58    FHL:[id number] except as listed in admit H&P  Blood pressure (!) 110/59, pulse (!) 108, temperature 98.3 F (36.8 C), temperature source Axillary, resp. rate 18, height '6\' 5"'  (1.956 m), weight 96.9 kg (213 lb 10 oz), SpO2 98 %.  PHYSICAL EXAM: Overall appearance:  Healthy appearing, in no distress.  He is breathing well but unable to talk. Head:  Normocephalic, atraumatic. Ears: External ears appear normal. Nose: External nose is healthy in appearance.  Oral Cavity/Pharynx: Significant trismus and swelling  of the tongue.  He does not allow any examination of the oral cavity Larynx/Hypopharynx: Deferred Neuro:  No identifiable neurologic deficits. Neck: No palpable neck masses.  Studies Reviewed: none  Procedures: Flexible fiberoptic laryngoscopy.  Topical Afrin/Xylocaine was applied to the nasal cavities.  The scope was passed down the right side.  There is diffuse crusting in both nasal cavities and a large septal perforation.  The hypopharynx and larynx are significant for some secretions pooling but no obstruction.  The view was somewhat limited due to lack of cooperation and significant discomfort.  There may be paresis of the left cord although I am not completely sure.  There is no obvious mass identified.   Assessment/Plan: No obvious airway mass identified around the larynx or hypopharynx.  Oral cavity and pharynx are very difficult to examine and the exam is very limited due to swelling, trismus, pain and lack of cooperation.  I would recommend transfer back to [facility] for continued care of this complicated postop hemorrhage.  His airway is stable at the moment but if he has any additional bleeding his airway could be a significant issue.  He may end up requiring tracheostomy.  Izora Gala 05/09/2017, 5:22 PM

## 2017-05-09 NOTE — Progress Notes (Signed)
Pharmacy Antibiotic Note  BOEN STERBENZ is a 36 y.o. male admitted on 05/03/2017 with recent lingual resection with bleeding.  Patient continues on Unasyn for aspiration PNA coverage.  Pt is afebrile and WBC is trending down.  Plan: Unasyn 3g IV every 6 hours.  Monitor fever curve, clinical response.  Monitor renal function.   Height: 6\' 5"  (195.6 cm) Weight: 213 lb 10 oz (96.9 kg) IBW/kg (Calculated) : 89.1  Temp (24hrs), Avg:99 F (37.2 C), Min:98.3 F (36.8 C), Max:99.8 F (37.7 C)  Recent Labs  Lab 05/03/17 0812 05/03/17 1344  05/03/17 2328 05/04/17 0409 05/05/17 0435 05/06/17 0442 05/07/17 0401 05/08/17 0429 05/09/17 0536  WBC  --  10.9*   < >  --  11.4* 15.3* 18.9* 15.3*  --  8.9  CREATININE  --  0.92  --   --  0.93 0.89 0.87 0.86 0.88 0.91  LATICACIDVEN 1.51 0.9  --  0.8  --   --   --   --   --   --    < > = values in this interval not displayed.    Estimated Creatinine Clearance: 141.4 mL/min (by C-G formula based on SCr of 0.91 mg/dL).    No Known Allergies  Antimicrobials this admission: Cefepime 12/9 >>12/10 Unasyn 12/10 >>  Dose adjustments this admission:   Microbiology results: 12/9 Blood x 2 - ngtd 12/9 Urine - ?never sent 12/9 MRSA pcr negative  Thank you for allowing pharmacy to be a part of this patient's care.  [employer], Pharm.D., BCPS Clinical Pharmacist Pager: [phone] Clinical phone for 05/09/2017 from 8:30-4:00 is [phone]. After 4pm, please call Main Rx [phone]) for assistance. 05/09/2017 3:20 PM

## 2017-05-09 NOTE — Progress Notes (Signed)
  Speech Language Pathology Treatment: Dysphagia  Patient Details Name: Joel Mathis MRN: [id number] DOB: 07-05-1980 Today's Date: 05/09/2017 Time: [id number] SLP Time Calculation (min) (ACUTE ONLY): 18 min  Assessment / Plan / Recommendation Clinical Impression  Pt was more willing to try POs today, still reporting a 9/10 pain level, but feeling more confident in his swallowing. He does acknowledge that he was not able to move his tongue any more after the initial surgery, but he was able to consume liquids and even "soft foods" per his girlfriend's report. They also share that he has been allowing more oral care. Pt tried to self-feed a cup sip of thin liquids but he was not able to get sufficient seal, therefore he tried with a spoon. The majority of the boluses spilled anteriorly from his lips. What little he did swallow, resulted in significant pain and pt did not want to try any more. He acknowledges that he is not ready to start eating and drinking by mouth yet, and he also acknowledges that he needs nutrition to heal. He is resistant to a Cortrak because of his pain levels and concern for rebleeding. Perhaps placement under fluoroscopy with use of numbing agent could be considered? Will continue to follow for readiness to start PO intake.   HPI HPI: Pt is a 36 yo male admitted with copious amounts of lingual bleeding s/p embolization in IR x1. Pt had recent resection of L squamous cell lingual cancer at [facility] (~2 weeks prior to admission). Pt was intubated 12/7-12/9. A mass was found in his airway during the intubation. No other PMH is known.      SLP Plan  Continue with current plan of care       Recommendations  Diet recommendations: NPO Medication Administration: Via alternative means                Oral Care Recommendations: Oral care QID Follow up Recommendations: Outpatient SLP;Home health SLP SLP Visit Diagnosis: Dysphagia, oropharyngeal phase  (R13.12) Plan: Continue with current plan of care       GO                [name] 05/09/2017, 9:04 AM  [name], M.A. CCC-SLP [phone]

## 2017-05-10 ENCOUNTER — Inpatient Hospital Stay (HOSPITAL_COMMUNITY): Payer: Medicaid Other

## 2017-05-10 LAB — CULTURE, BLOOD (ROUTINE X 2)
CULTURE: NO GROWTH
Culture: NO GROWTH
SPECIAL REQUESTS: ADEQUATE

## 2017-05-10 MED ORDER — LIDOCAINE VISCOUS 2 % MT SOLN
OROMUCOSAL | Status: AC
  Administered 2017-05-10: 13:00:00
  Filled 2017-05-10: qty 15

## 2017-05-10 MED ORDER — IOPAMIDOL (ISOVUE-300) INJECTION 61%
INTRAVENOUS | Status: AC
  Administered 2017-05-10: 13:00:00
  Filled 2017-05-10: qty 50

## 2017-05-10 MED ORDER — LORAZEPAM BOLUS VIA INFUSION
1.0000 mg | Freq: Two times a day (BID) | INTRAVENOUS | Status: DC | PRN
  Filled 2017-05-10: qty 1

## 2017-05-10 MED ORDER — LORAZEPAM 2 MG/ML IJ SOLN
1.0000 mg | Freq: Two times a day (BID) | INTRAMUSCULAR | Status: DC | PRN
  Administered 2017-05-10: 1 mg via INTRAVENOUS
  Filled 2017-05-10: qty 1

## 2017-05-10 MED ORDER — JEVITY 1.5 CAL/FIBER PO LIQD
1000.0000 mL | ORAL | Status: DC
  Administered 2017-05-10: 1000 mL
  Filled 2017-05-10 (×3): qty 1000

## 2017-05-10 MED ORDER — IOPAMIDOL (ISOVUE-300) INJECTION 61%
50.0000 mL | Freq: Once | INTRAVENOUS | Status: AC | PRN
  Administered 2017-05-10: 20 mL via ORAL

## 2017-05-10 MED ORDER — LIDOCAINE VISCOUS 2 % MT SOLN
15.0000 mL | Freq: Once | OROMUCOSAL | Status: AC
  Administered 2017-05-10 (×2): 15 mL via OROMUCOSAL
  Filled 2017-05-10: qty 15

## 2017-05-10 NOTE — Progress Notes (Signed)
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 Pt is s/p lingual artery embolization X2 with resolved bleeding. Pt breathing has been stable since extubation. Pt has failed swallow evaluation and placement of Cortrak feeding tube has been recommended of which patient is refusing. Pt is on IV Unasyn for aspiration PNA coverage.   Today, pt is still not able to swallow, still c/o oral pain. Denies any chest pain, SOB, abdominal pain, N/V/D/C, fever/chills. Pt is in no resp distress, continues to sat well on RA. Plan is to transfer to [facility], pt already accepted.  Patient is agreeable for "crack/NG tube placement for feeding as long as he cannot feel the pain.   Assessment & Plan:   Active Problems:   History of oral surgery   Epistaxis   Anemia   Acute post-hemorrhagic anemia   Hemorrhagic shock (HCC)   Acute respiratory failure with hypoxia (HCC) #Acute respiratory failure secondary to lingual bleeding in the setting of recent squamous cell resection of left lingual area Resolved, currently off O2 S/P intubation on 12/7 and extubated on 12/9, stable Failed swallowing evaluation 05/06/2017, recommended Cortrak of which pt adamantly refused despite education ENT consulted due to questionable mass noted in airway during intubation. Flexible fiberoptic laryngoscopy done, with no obvious mass identified. Swelling, trismus, pain and lack of cooperation limited the exam Spoke extensively to Dr Rodman Pickle (accepting physician) and RN to Dr Marsing Callas in [place] who both awaiting patient transfer to [facility]. RN to Dr S.N.P.J. Callas (who performed intial surgery) [place] is [phone] Awaiting bed availability  for transfer to [place]. Pt already accepted Pulmonary hygeine Continue NPO, IVF D5/NS Monitor closely  #Lingual bleeding post interventional radiology embolization of lingual artery Resolved Status post embolization x2 with no further breathing since 05/07/17 Daily CBC  #SIRS, no known source Recent left lingual resection approximately 2 weeks ago at [facility] for squamous cell carcinoma. Last temp 101.5 on 12/11, resolved leukocytosis Consider oral pharyngeal infection vs aspiration PNA Vs HCAP BC no growth till date Procalcitonin 0.18-->0.31 CXR showed minimal left basilar subsegmental atelectasis Continue IV unasyn to cover anaerobes  If temp spike, please draw BC Monitor closely  #HTN Controlled ?? Undiagnosed Vs setting of acute pain Clonidine patch in place IV Metoprolol prn IV hydralazine prn  #AKI Resolved  NPO-called interventional radiology to see if they can place a tube today it will be scheduled for today at 1:00 after giving him Ativan and morphine.  This was discussed with the patient.     DVT prophylaxis: Lovenox Code Status full code Family Communication: Discussed with girlfriend at the bedside Disposition Plan?  Plan is to discharge him to [facility] once to have a bed available I did call the number we have in file to find out if they have a bed as of now they do not know when a bed will be available.  If there is no need to transfer patient may be able to go home with the NG tube in place with home health. Consultants:  ENT, P CCM procedures embolization of lingual artery x2  Antimicrobials:  Unasyn Subjective: He states he will agree for core tract tube placement as long as he is not  in pain.  Objective: Resting in bed Vitals:   05/09/17 0601 05/09/17 1411 05/09/17 2221 05/10/17 0531  BP: 117/65 (!) 110/59 102/67 108/65  Pulse: (!) 106 (!) 108 98 97  Resp: 18 18 (!) 24 16  Temp: 98.9 F (37.2 C) 98.3 F (36.8 C) 99.2  F (37.3 C) 98.8 F (37.1 C)  TempSrc: Axillary Axillary Axillary Axillary  SpO2: 98% 98% 100% 100%  Weight:      Height:        Intake/Output Summary (Last 24 hours) at 05/10/2017 1434 Last data filed at 05/10/2017 0533 Gross per 24 hour  Intake 2830 ml  Output 695 ml  Net 2135 ml   Filed Weights   05/06/17 0415 05/07/17 0349 05/08/17 0613  Weight: 101.3 kg (223 lb 5.2 oz) 98.2 kg (216 lb 7.9 oz) 96.9 kg (213 lb 10 oz)    Examination:  General exam: Appears calm and comfortable  Respiratory system: Clear to auscultation. Respiratory effort normal. Cardiovascular system: S1 & S2 heard, RRR. No JVD, murmurs, rubs, gallops or clicks. No pedal edema. Gastrointestinal system: Abdomen is nondistended, soft and nontender. No organomegaly or masses felt. Normal bowel sounds heard. Central nervous system: Alert and oriented. No focal neurological deficits. Extremities: Symmetric 5 x 5 power. Skin: No rashes, lesions or ulcers Psychiatry: Judgement and insight appear normal. Mood & affect appropriate.     Data Reviewed: I have personally reviewed following labs and imaging studies  CBC: Recent Labs  Lab 05/04/17 0409 05/05/17 0435 05/06/17 0442 05/07/17 0401 05/09/17 0536  WBC 11.4* 15.3* 18.9* 15.3* 8.9  NEUTROABS 9.3*  --   --   --  6.2  HGB 9.6* 9.5* 9.8* 9.9* 9.1*  HCT 28.3* 29.0* 30.6* 30.1* 28.1*  MCV 77.1* 78.8 80.1 79.6 79.4  PLT 184 211 247 314 102   Basic Metabolic Panel: Recent Labs  Lab 05/04/17 0409 05/05/17 0435 05/06/17 0442 05/07/17 0401 05/08/17 0429 05/09/17 0536  NA 135 134* 131* 132* 132* 137  K 3.5 3.3* 4.1 3.9 3.5 3.5  CL 109 107 101 99* 100* 102  CO2 20* 20* 21* 24 23 23   GLUCOSE 99 109* 114* 107* 94 115*  BUN 9 6 6 8 10 8   CREATININE 0.93 0.89 0.87 0.86 0.88 0.91  CALCIUM 7.6* 7.9* 8.3* 8.7* 8.5* 8.9  MG 1.7 1.9 2.0 1.9 2.0  --   PHOS 2.0* 1.9* 2.4*  --  2.7  --    GFR: Estimated Creatinine Clearance: 141.4 mL/min (by C-G formula  based on SCr of 0.91 mg/dL). Liver Function Tests: No results for input(s): AST, ALT, ALKPHOS, BILITOT, PROT, ALBUMIN in the last 168 hours. No results for input(s): LIPASE, AMYLASE in the last 168 hours. No results for input(s): AMMONIA in the last 168 hours. Coagulation Profile: Recent Labs  Lab 05/03/17 2250 05/07/17 0401  INR 1.15 1.13   Cardiac Enzymes: No results for input(s): CKTOTAL, CKMB, CKMBINDEX, TROPONINI in the last 168 hours. BNP (last 3 results) No results for input(s): PROBNP in the last 8760 hours. HbA1C: No results for input(s): HGBA1C in the last 72 hours. CBG: Recent Labs  Lab 05/06/17 1215 05/06/17 1738 05/06/17 1911 05/06/17 2304 05/07/17 0338  GLUCAP 103* 91 105* 98 110*   Lipid Profile: No results for input(s): CHOL, HDL, LDLCALC, TRIG, CHOLHDL, LDLDIRECT in the last 72 hours. Thyroid Function Tests: No results for input(s): TSH, T4TOTAL, FREET4, T3FREE, THYROIDAB in the last 72 hours. Anemia Panel: No results for input(s): VITAMINB12, FOLATE, FERRITIN,  TIBC, IRON, RETICCTPCT in the last 72 hours. Sepsis Labs: Recent Labs  Lab 05/03/17 2328 05/05/17 1153 05/06/17 0442  PROCALCITON  --  0.18 0.31  LATICACIDVEN 0.8  --   --     Recent Results (from the past 240 hour(s))  MRSA PCR Screening     Status: None   Collection Time: 05/04/17  1:57 AM  Result Value Ref Range Status   MRSA by PCR NEGATIVE NEGATIVE Final    Comment:        The GeneXpert MRSA Assay (FDA approved for NASAL specimens only), is one component of a comprehensive MRSA colonization surveillance program. It is not intended to diagnose MRSA infection nor to guide or monitor treatment for MRSA infections.   Culture, blood (Routine X 2) w Reflex to ID Panel     Status: None   Collection Time: 05/05/17  1:27 PM  Result Value Ref Range Status   Specimen Description BLOOD  Final   Special Requests   Final    IN BOTH AEROBIC AND ANAEROBIC BOTTLES Blood Culture adequate  volume   Culture NO GROWTH 5 DAYS  Final   Report Status 05/10/2017 FINAL  Final  Culture, blood (Routine X 2) w Reflex to ID Panel     Status: None   Collection Time: 05/05/17  6:15 PM  Result Value Ref Range Status   Specimen Description BLOOD LEFT HAND  Final   Special Requests IN PEDIATRIC BOTTLE Blood Culture adequate volume  Final   Culture NO GROWTH 5 DAYS  Final   Report Status 05/10/2017 FINAL  Final         Radiology Studies: No results found.      Scheduled Meds: . chlorhexidine  15 mL Mouth Rinse BID  . cloNIDine  0.2 mg Transdermal Weekly  . enoxaparin (LOVENOX) injection  40 mg Subcutaneous Q24H  . iopamidol      . lidocaine      . mouth rinse  15 mL Mouth Rinse q12n4p   Continuous Infusions: . sodium chloride Stopped (05/10/17 0516)  . ampicillin-sulbactam (UNASYN) IV Stopped (05/10/17 1118)  . dextrose 5 % and 0.9% NaCl 100 mL/hr at 05/10/17 0430     LOS: 7 days      [name], MD [facility]  If 7PM-7AM, please contact night-coverage [email or web address] Password [facility] 05/10/2017, 2:34 PM

## 2017-05-11 DIAGNOSIS — N289 Disorder of kidney and ureter, unspecified: Secondary | ICD-10-CM

## 2017-05-11 DIAGNOSIS — C029 Malignant neoplasm of tongue, unspecified: Secondary | ICD-10-CM

## 2017-05-11 DIAGNOSIS — K1379 Other lesions of oral mucosa: Secondary | ICD-10-CM

## 2017-05-11 LAB — CBC WITH DIFFERENTIAL/PLATELET
BASOS PCT: 0 %
Basophils Absolute: 0 10*3/uL (ref 0.0–0.1)
EOS ABS: 0.1 10*3/uL (ref 0.0–0.7)
Eosinophils Relative: 2 %
HEMATOCRIT: 27.2 % — AB (ref 39.0–52.0)
Hemoglobin: 9 g/dL — ABNORMAL LOW (ref 13.0–17.0)
Lymphocytes Relative: 13 %
Lymphs Abs: 1.2 10*3/uL (ref 0.7–4.0)
MCH: 26.3 pg (ref 26.0–34.0)
MCHC: 33.1 g/dL (ref 30.0–36.0)
MCV: 79.5 fL (ref 78.0–100.0)
MONO ABS: 0.8 10*3/uL (ref 0.1–1.0)
MONOS PCT: 9 %
Neutro Abs: 7 10*3/uL (ref 1.7–7.7)
Neutrophils Relative %: 76 %
Platelets: 459 10*3/uL — ABNORMAL HIGH (ref 150–400)
RBC: 3.42 MIL/uL — ABNORMAL LOW (ref 4.22–5.81)
RDW: 15.2 % (ref 11.5–15.5)
WBC: 9.1 10*3/uL (ref 4.0–10.5)

## 2017-05-11 LAB — BASIC METABOLIC PANEL
Anion gap: 7 (ref 5–15)
BUN: 6 mg/dL (ref 6–20)
CALCIUM: 8.6 mg/dL — AB (ref 8.9–10.3)
CO2: 25 mmol/L (ref 22–32)
CREATININE: 0.97 mg/dL (ref 0.61–1.24)
Chloride: 105 mmol/L (ref 101–111)
GFR calc non Af Amer: >60 mL/min (ref >60)
Glucose, Bld: 128 mg/dL — ABNORMAL HIGH (ref 65–99)
Potassium: 3.7 mmol/L (ref 3.5–5.1)
SODIUM: 137 mmol/L (ref 135–145)

## 2017-05-11 MED ORDER — OXYCODONE-ACETAMINOPHEN 5-325 MG PO TABS
1.0000 | ORAL_TABLET | Freq: Four times a day (QID) | ORAL | Status: DC | PRN
  Administered 2017-05-11: 2 via ORAL
  Administered 2017-05-11: 1 via ORAL
  Administered 2017-05-11: 2 via ORAL
  Filled 2017-05-11 (×3): qty 2

## 2017-05-11 MED ORDER — FREE WATER
200.0000 mL | Status: DC
  Administered 2017-05-11 (×2): 200 mL

## 2017-05-11 MED ORDER — JEVITY 1.5 CAL/FIBER PO LIQD
1000.0000 mL | ORAL | Status: DC
  Administered 2017-05-11: 1000 mL
  Filled 2017-05-11 (×2): qty 1000

## 2017-05-11 MED ORDER — PRO-STAT SUGAR FREE PO LIQD
30.0000 mL | Freq: Every day | ORAL | Status: DC
  Administered 2017-05-11: 30 mL

## 2017-05-11 NOTE — Progress Notes (Signed)
Nutrition Follow-up  DOCUMENTATION CODES:  Not applicable  INTERVENTION:  Increase Jevity 1.5 rate to 65 cc/day (1560 ml per day) and Prostat 30 ml 24 hrs to provide 2440 kcals, 115 gm protein, 1186 ml free water daily.  Add Free water flush of 200 cc q 4 hrs to provide additional 1.2 Liters and a total of 2386 mls free water.   NUTRITION DIAGNOSIS:  Inadequate oral intake related to inability to eat as evidenced by NPO status.  -Ongoing  GOAL:  Patient will meet greater than or equal to 90% of their needs   -progressing w/ NGT  MONITOR:  I & O's, Labs, Diet advancement, Skin, Weight trends  ASSESSMENT:  36 y.o male who developed ear pain while incarcerated. Dx with SCC of L base of  tongue s/p resection approximately 2 weeks ago. Presented to ED with profuse acute bleeding from mouth w/ bloody emesis. Underwent superselective embolization 12/7 followed by a repeat embolization on same day d/t recurrent bleeding  Patient had initially refused Cortrak placement on 12/12. However, he became agreeable later on if he would be assured he wouldn't feel pain. S/p placement by IR on 12/14 w/ tube tip at distal duodenum. RD consulted for initiation of TF.   Patient will be transferred to [facility] as soon as bed is available. He is on the waiting list.   Noted patient is at new low weight of 209 lbs 10.5 oz.   Estimated needs and TF are adjusted.   Currently, TF infusing @ 20. patient reports that he has not had any n/v/d or any other s/s of intolerance. Told RD would advance to goal rate. He says he is being transferred to [facility] today.    Labs BG: 128, H/H:9/27.2 Meds: Jevity 1.5, IV abx, IVF, IV morphine,   Recent Labs  Lab 05/05/17 0435 05/06/17 0442 05/07/17 0401 05/08/17 0429 05/09/17 0536 05/11/17 0333  NA 134* 131* 132* 132* 137 137  K 3.3* 4.1 3.9 3.5 3.5 3.7  CL 107 101 99* 100* 102 105  CO2 20* 21* 24 23 23 25   BUN 6 6 8 10 8 6   CREATININE 0.89 0.87 0.86 0.88 0.91 0.97   CALCIUM 7.9* 8.3* 8.7* 8.5* 8.9 8.6*  MG 1.9 2.0 1.9 2.0  --   --   PHOS 1.9* 2.4*  --  2.7  --   --   GLUCOSE 109* 114* 107* 94 115* 128*   Diet Order:  Diet NPO time specified  EDUCATION NEEDS:  No education needs have been identified at this time  Skin:  Skin Assessment: Reviewed RN Assessment  Last BM:  12/10  Height:  Ht Readings from Last 1 Encounters:  05/03/17 6\' 5"  (1.956 m)   Weight:  Wt Readings from Last 1 Encounters:  05/11/17 209 lb 10.5 oz (95.1 kg)   Wt Readings from Last 10 Encounters:  05/11/17 209 lb 10.5 oz (95.1 kg)  04/29/17 215 lb (97.5 kg)  03/15/16 164 lb (74.4 kg)  01/16/16 165 lb (74.8 kg)  01/27/14 175 lb (79.4 kg)   Ideal Body Weight:  94.55 kg  BMI:  Body mass index is 24.86 kg/m.  Estimated Nutritional Needs:  Kcal:  2400-2650 kcals (25-28 kcal/kg bw) Protein:  105-125g Pro (1.1-1.3 g/kg bw) Fluid:  2.4-2.7 L fluid (1 ml/kcal)  Burtis Junes RD, LDN, CNSC Clinical Nutrition Pager: [phone] 05/11/2017 3:42 PM

## 2017-05-11 NOTE — Discharge Summary (Signed)
Discharge Summary  Joel Mathis JTT:[id number] DOB: [birth year]  PCP: Patient, No Pcp Per  Admit date: 05/03/2017 Discharge date: 05/11/2017  Time spent: >30 mins  Recommendations for Outpatient Follow-up:  1. To be determined in [facility] 2. PCP upon discharge  Discharge Diagnoses:  Active Hospital Problems   Diagnosis Date Noted  . History of oral surgery 05/03/2017  . Epistaxis 05/03/2017  . Anemia 05/03/2017  . Acute post-hemorrhagic anemia   . Hemorrhagic shock ([place])   . Acute respiratory failure with hypoxia [facility])     Resolved Hospital Problems  No resolved problems to display.    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 Pt is s/p lingual artery embolization X2 with resolved bleeding. Pt breathing has been stable since extubation. Pt has failed swallow evaluation X 2 and placement of Cortrak feeding tube with tube feeding has been started. Pt is still unable to talk. Pt is on IV Unasyn for aspiration PNA coverage.  Today, still c/o oral pain, unable to talk, trismus present with minimal drooling. Denies any chest pain, SOB, abdominal pain, N/V/D/C, fever/chills. Pt is in no resp distress, continues to sat well on RA. Pt is stable to be transferred to [facility] for further management.  Hospital Course:  Active Problems:   History of oral surgery   Epistaxis   Anemia   Acute post-hemorrhagic anemia   Hemorrhagic shock (HCC)   Acute respiratory failure with hypoxia (HCC)  #Acute  respiratory failure secondary to lingual bleeding in the setting of recent squamous cell resection of left lingual area Resolved, currently off O2 S/P intubation on 12/7 and extubated on 12/9, stable Failed swallowing evaluation X 2, currently on Cortrak with tube feedings for now ENT consulted due to questionable mass noted in airway during intubation. Flexible fiberoptic laryngoscopy done, with no obvious mass identified. Swelling, trismus, pain and lack of cooperation limited the exam Spoke extensively to Dr Rodman Pickle (accepting physician) and RN to Dr Elgin Callas in [place] who both awaiting patient transfer to [facility]. RN to Dr Volta Callas (who performed intial surgery) Brent is [phone] Transfer to [place] for further management  #Lingual bleeding post interventional radiology embolization of lingual artery Resolved Status post embolization x2 with no further breathing since 05/07/17 Follow up closely  #SIRS, no known source Recent left lingual resection approximately 2 weeks ago at [facility] for squamous cell carcinoma. Last temp 101.5 on 12/11, currently afebrile, with resolved leukocytosis Likely oral pharyngeal infection vs aspiration PNA Vs HCAP BC no growth till date Procalcitonin 0.18-->0.31 CXR showed minimal left basilar subsegmental atelectasis Continue IV unasyn to cover anaerobes for a total of 7 days. Today is day 6/7  #HTN Controlled ?? Undiagnosed Vs setting of acute pain Clonidine patch in place, consider removing  #AKI Resolved    Procedures:  Embolization of lingual artery X 2  Consultations:  PCCM  ENT  Discharge Exam: BP 122/75 (BP Location: Left Arm)   Pulse 93   Temp 98.2 F (  36.8 C) (Axillary)   Resp 20   Ht 6\' 5"  (1.956 m)   Wt 95.1 kg (209 lb 10.5 oz)   SpO2 98%   BMI 24.86 kg/m   General: Awake, alert, able to follow commands, not able to speak Cardiovascular: S1-S2 present, no added hrt sound Respiratory: Chest  clear B/L  Discharge Instructions You were cared for by a hospitalist during your hospital stay. If you have any questions about your discharge medications or the care you received while you were in the hospital after you are discharged, you can call the unit and asked to speak with the hospitalist on call if the hospitalist that took care of you is not available. Once you are discharged, your primary care physician will handle any further medical issues. Please note that NO REFILLS for any discharge medications will be authorized once you are discharged, as it is imperative that you return to your primary care physician (or establish a relationship with a primary care physician if you do not have one) for your aftercare needs so that they can reassess your need for medications and monitor your lab values.  Discharge Instructions    Diet - low sodium heart healthy   Complete by:  As directed    Increase activity slowly   Complete by:  As directed      Allergies as of 05/11/2017   No Known Allergies     Medication List    STOP taking these medications   naproxen sodium 220 MG tablet Commonly known as:  ALEVE     TAKE these medications   acetaminophen 500 MG tablet Commonly known as:  TYLENOL Take 1,000 mg by mouth every 6 (six) hours as needed for mild pain.      No Known Allergies    The results of significant diagnostics from this hospitalization (including imaging, microbiology, ancillary and laboratory) are listed below for reference.    Significant Diagnostic Studies: Dg Abd 1 View  Result Date: 05/10/2017 CLINICAL DATA:  36 year old male status post feeding tube placement under fluoroscopy. Recent surgery for tongue mass. EXAM: ABDOMEN - 1 VIEW COMPARISON:  Neck CTA 05/03/2017. FINDINGS: Single fluoroscopic image of the lower chest and upper abdomen demonstrating a feeding tube in place coursing through the stomach and to the distal duodenum. Injected oral contrast  outlines the duodenal mucosa. IMPRESSION: Enteric feeding tube placed into the distal duodenum with fluoroscopic assistance. Electronically Signed   By: Genevie Ann M.D.   On: 05/10/2017 14:49   Ct Angio Neck W And/or Wo Contrast  Result Date: 05/03/2017 CLINICAL DATA:  Surgery for a tongue mass 2 weeks ago.  Hematemesis. EXAM: CT ANGIOGRAPHY NECK TECHNIQUE: Multidetector CT imaging of the neck was performed using the standard protocol during bolus administration of intravenous contrast. Multiplanar CT image reconstructions and MIPs were obtained to evaluate the vascular anatomy. Carotid stenosis measurements (when applicable) are obtained utilizing NASCET criteria, using the distal internal carotid diameter as the denominator. CONTRAST:  75mL ISOVUE-370 IOPAMIDOL (ISOVUE-370) INJECTION 76% COMPARISON:  Soft tissue neck CT 01/04/2014 FINDINGS: Aortic arch: Standard 3 vessel aortic arch. Brachiocephalic and subclavian arteries are widely patent. Right carotid system: Patent without evidence of stenosis, dissection, or aneurysm. Left carotid system: Patent without evidence of stenosis, dissection, or aneurysm. Vertebral arteries: Patent and codominant without evidence of stenosis or dissection. Skeleton: No acute osseous abnormality or suspicious osseous lesion. Other neck: There is a postoperative defect in the left tongue base consistent with history of recent tumor  resection. There is some attenuation of the left lingual artery in the operative region, however no active extravasation or pseudoaneurysm is identified. There is some asymmetric soft tissue fullness/swelling laterally in the left oropharynx. This examination was not tailored to assess the soft tissues, and correlation with any more recent outside preoperative imaging is suggested. A 9 mm short axis right level IIa lymph node is unchanged from 2015. Upper chest: Partially visualized patchy ground-glass opacities in the posterior right upper lobe.  IMPRESSION: 1. Postoperative changes from recent tongue base mass resection. No active arterial extravasation or pseudoaneurysm identified in this region. 2. Partially visualized pulmonary ground-glass opacities in the right upper lobe which could represent infection or hemorrhage. Electronically Signed   By: Logan Bores M.D.   On: 05/03/2017 07:20   Ir Transcath/emboliz  Result Date: 05/08/2017 CLINICAL DATA:  Extensive oropharyngeal uncontrolled hemorrhage. Patient with history of left sided lingual squamous cell carcinoma. EXAM: BILATERAL COMMON CAROTID AND INNOMINATE ANGIOGRAPHY; IR ANGIO EXTERNAL CAROTID SEL EXT CAROTID BILAT MOD SED; IR NEURO EACH ADD'L AFTER BASIC UNI LEFT (MS); IR ANGIO VERTEBRAL SEL VERTEBRAL BILAT MOD SED; IR NEURO EACH ADD'L AFTER BASIC UNI RIGHT (MS); ARTERIOGRAPHY; TRANSCATHETER THERAPY EMBOLIZATION MEDICATIONS: Heparin 1000 units IV; Ancef 2 g IV antibiotic was administered within 1 hour of the procedure. ANESTHESIA/SEDATION: General anesthesia. CONTRAST:  Isovue 300 approximately 150 mL. FLUOROSCOPY TIME:  Fluoroscopy Time: 34 minutes 18 seconds (3429 mGy). COMPLICATIONS: Joel Mathis immediate. TECHNIQUE: Informed written consent was obtained from the patient after a thorough discussion of the procedural risks, benefits and alternatives. All questions were addressed. Maximal Sterile Barrier Technique was utilized including caps, mask, sterile gowns, sterile gloves, sterile drape, hand hygiene and skin antiseptic. A timeout was performed prior to the initiation of the procedure. The right groin was prepped and draped in the usual sterile fashion. Thereafter using modified Seldinger technique, transfemoral access into the right common femoral artery was obtained without difficulty. Over a 0.035 inch guidewire, a 5 French Pinnacle sheath was inserted. Through this, and also over 0.035 inch guidewire, a 5 [place] JB 1 catheter was advanced to the aortic arch region and selectively  positioned in the right common carotid artery, the right external carotid artery, the right vertebral artery, the left common carotid artery, left external carotid artery and the left vertebral artery. FINDINGS: The right vertebral artery origin is widely patent. The vessel is seen to opacify normally to the cranial skull base. Wide patency is seen of the right vertebrobasilar junction and the right posterior-inferior cerebellar artery. The basilar artery, the posterior cerebral arteries, the superior cerebellar arteries and the anterior-inferior cerebellar arteries opacify normally into the capillary and venous phases. The right common carotid arteriogram demonstrates the right external carotid artery and its major branches to be widely patent. Abnormal prominence is seen of the right lingual artery, and right facial artery distally. The remaining branches appear to be of normal caliber. There is no acute extravasation of contrast or of mass-effect noted. The right internal carotid artery at the bulb to the cranial skull base opacifies normally. The petrous, cavernous and supraclinoid segments are widely patent. The right middle cerebral artery and the right anterior cerebral artery opacify normally into the capillary and venous phases. The left vertebral artery origin is widely patent. The vessel is seen to opacify normally to the cranial skull base. Wide patency is seen of the left vertebrobasilar junction and the left posterior inferior cerebellar artery. The visualized portion of the proximal half of the basilar  artery including the anterior-inferior cerebellar arteries is grossly patent into delayed arterial phase. The left common carotid arteriogram demonstrates the left internal carotid artery and its major branches to be widely patent. The petrous, cavernous and supraclinoid segments demonstrate wide patency. The left middle cerebral artery and left anterior cerebral artery opacify normally into the  capillary and venous phases. A selective left external carotid arteriogram demonstrates abnormal prominence of the left lingual artery, the facial artery and the ascending pharyngeal artery. There is no acute extravasation of contrast noted on the images taken. ENDOVASCULAR SUPER SELECTIVE EMBOLIZATION OF THE LEFT AND RIGHT EXTERNAL CAROTID ARTERY BRANCHES NAMELY THE LEFT AND RIGHT LINGUAL ARTERIES, THE ASCENDING PHARYNGEAL ARTERY ON THE LEFT, AND THE MEDIAL PHARYNGEAL BRANCHES OF THE FACIAL ARTERY WITH PVA PARTICLES SIZES 300-500 MICRONS, AND 500-700 MICRONS. The diagnostic JB 1 catheter in the left common carotid artery was exchanged over a 0.035 inch 300 cm Rosen exchange guidewire for an 80 cm 6 [place] Cook shuttle sheath using biplane roadmap technique and constant fluoroscopic guidance. Good aspiration obtained from the hub of the [place] shuttle sheath. A gentle contrast injection demonstrated no evidence of spasms, dissections or of intraluminal filling defects. Over a 0.035 inch Roadrunner guidewire, using biplane roadmap technique and constant fluoroscopic guidance, the tip of the Cook shuttle sheath was then advanced into the proximal left external carotid artery just proximal to the origin of the superior thyroidal artery. Thereafter, using biplane roadmap technique and constant fluoroscopic guidance, in a coaxial manner and with constant heparinized saline infusion, a rapid transit 2 tip microcatheter was then advanced over 0.014 inch Softip Synchro micro guidewire to selectively position in the left lingual artery, the left facial artery distally, and also the ascending pharyngeal artery. These were selected consecutively. They were advanced over a 0.014 inch Softip Synchro micro guidewire to the point where embolization was to begin. Prior to the embolization with the 300-500 micron particles, and 500-700 micro particles, control arteriograms were performed through the microcatheter to ensure  safe position, and also to exclude dangerous anastomosis with the left vertebral artery or the left internal carotid artery. Also opacification of the left ophthalmic artery with the ciliary blush was also monitored. Embolization in all these three vessels was continued under intermittent fluoroscopy until there was stasis and/or reflux. The microcatheter position was then retrieved proximally until safe embolization had been undertaken. At the end of the embolization of the left external carotid artery branches as described above, a control arteriogram performed through the [facility] shuttle sheath just proximal to the origin of the left external carotid artery demonstrated angiographic stasis in the ascending pharyngeal artery distally, the lingual artery in its distal 2/3, and the pharygneal and lingual branches arising from the facial artery. A control arteriogram performed intracranially demonstrated no evidence of intraluminal filling defects or of occlusions. In a similar fashion, the right external carotid artery was accessed through the 6 [place] Arrow sheath positioned in the right common carotid artery over an exchange guidewire. A MDP soft tip 6 [place] guide catheter was engaged at the origin of the right external carotid artery. Again with biplane roadmap technique and constant fluoroscopic guidance, in a coaxial manner and with constant heparinized saline infusion access was obtained with the rapid transit 2 tip microcatheter over a 0.014 inch Softip Synchro micro guidewire into the right lingual artery, and the right facial artery just proximal to the floor of the tongue branches. Again after having eliminated dangerous anastomosis, embolization was performed  as described above with PVA particles 500-700 microns, and 300-500 microns until there was stasis and/or reflux. Control was obtained of the right external carotid origin to prevent reflux into the internal carotid artery. At the end of the  embolization, a control arteriogram performed through the 6 [place] MDP Brite tip guide catheter again demonstrated complete stasis within the right lingual artery and also the facial branches. Intracranially no evidence of intraluminal filling defects or of occlusions were seen. Throughout the procedure, the patient's blood pressure and neurological status remained stable. The patient was given approximately 2000 units of Heparin IV. The 6 [place] Arrow neurovascular sheath was then retrieved with the 6 [place] Brite tip MDP Envoy guide catheter into the abdominal aorta and exchanged over a J-tip guidewire for a 6 [place] Pinnacle sheath. This in turn was then removed with the application of an external closure device. The right groin appeared soft without evidence of hematoma. Distal pulses remained 2+ in the dorsalis pedis and the posterior tibial arteries. At the end of the procedure, the patient was then transported to the neuro ICU to undergo further evaluation. IMPRESSION: Status post endovascular super selective embolization of external carotid artery branches, the left lingual artery, the right lingual artery, the floor of the mouth branches arising from the facial arteries bilaterally, and the left ascending pharyngeal artery with PVA particles sizes 300-500 microns, and 500-700 microns. PLAN: The patient was transferred to ICU for medical and hemodynamic stabilization. Electronically Signed   By: Luanne Bras M.D.   On: 05/07/2017 21:25   Ir Transcath/emboliz  Result Date: 05/08/2017 CLINICAL DATA:  Recurrent copious oropharyngeal hemorrhage. Patient with lingual carcinoma. EXAM: BILATERAL COMMON CAROTID AND INNOMINATE ANGIOGRAPHY; IR NEURO EACH ADD'L AFTER BASIC UNI LEFT (MS); TRANSCATHETER THERAPY EMBOLIZATION; IR ANGIO EXTERNAL CAROTID SEL EXT CAROTID BILAT MOD SED; IR NEURO EACH ADD'L AFTER BASIC UNI RIGHT (MS); ARTERIOGRAPHY COMPARISON:  Diagnostic catheter arteriogram of 05/03/2017 earlier in  the day. MEDICATIONS: Heparin 3,000 units IV; Ancef 2 g IV antibiotic was administered within 1 hour of the procedure. ANESTHESIA/SEDATION: General anesthesia. CONTRAST:  Isovue 300 250 cc. FLUOROSCOPY TIME:  Fluoroscopy Time: 51 minutes 18 seconds (3795 mGy). COMPLICATIONS: Joel Mathis immediate. TECHNIQUE: Informed written consent was obtained from the patient's mother after a thorough discussion of the procedural risks, benefits and alternatives. All questions were addressed. Maximal Sterile Barrier Technique was utilized including caps, mask, sterile gowns, sterile gloves, sterile drape, hand hygiene and skin antiseptic. A timeout was performed prior to the initiation of the procedure. The right groin was prepped and draped in the usual sterile fashion. Thereafter using modified Seldinger technique, transfemoral access into the right common femoral artery was obtained without difficulty. Over a 0.035 inch guidewire, a 5 French Pinnacle sheath was inserted. Through this, and also over 0.035 inch guidewire, a 5 [place] JB 1 catheter was advanced to the aortic arch region and selectively positioned in the right common carotid artery, the right external carotid artery, , the left common carotid artery and the left external carotid artery. FINDINGS: TREATMENT The right common carotid arteriogram again demonstrates the right internal carotid artery at the bulb to the cranial skull base to opacify normally. The petrous, cavernous and supraclinoid segments demonstrate wide patency. The right middle cerebral artery and the right anterior cerebral artery opacify normally into the capillary and venous phases. The right external carotid arteriogram demonstrated prominence of the lingual artery, the facial artery, the superior thyroidal artery, and the internal maxillary artery at the level of  the right transverse facial artery. The diagnostic JB 1 catheter was then exchanged over a 0.035 inch 300 cm Rosen exchange guidewire for a 6  French 65 cm Arrow neurovascular sheath using biplane roadmap technique and constant fluoroscopic guidance. Good aspiration obtained from the hub of the neurovascular Arrow sheath. Gentle contrast injection demonstrated no evidence of spasms, dissections or of intraluminal filling defects. This was then connected to continuous heparinized saline infusion. Over a [employer] guidewire, a 6 [place] MDP 90 cm Envoy guide catheter was then advanced and positioned just proximal to the right common carotid bifurcation. The guidewire was removed. Good aspiration obtained from the hub of the MDP guide catheter. A gentle contrast injection demonstrated no evidence of spasms, dissections or of intraluminal filling defects. Over a 0.035 inch Roadrunner guidewire, the tip of the MDP guide catheter was then advanced and positioned in the right external carotid artery just distal to the origin of the lingual artery. The guidewire was removed. Good aspiration obtained from the hub of the 6 [place] MDP guide catheter. A control arteriogram was then performed with roadmap technique. Thereafter, a rapid transit 2 tip microcatheter was then advanced over a 0.014 inch Softip Synchro micro guidewire to the distal end of the microcatheter. The guidewire was removed. Good aspiration obtained from hub of the microcatheter. A gentle control arteriogram performed through the microcatheter demonstrated no dangerous abnormal communication between the internal carotid on the right side and and the right vertebral artery. Thereafter, the tip of the microcatheter in the internal maxillary artery on the right just proximal to the origin of the transverse facial artery, progressive embolization was then performed using PVA particles of sizes 250-300 microns, 350-500 microns, and 500-700 microns. These were mixed in with 50% contrast and 50% heparinized saline infusion. Control embolization was performed with biplane intermittent fluoroscopy  until there was stasis and/or reflux the tip of the microcatheter. Subsequent embolizations super selectively were then performed again using the rapid transit microcatheter with super selective cannulations of the right lingual artery, the right facial artery just proximal to the origin of the anterior mandibular branches, and also the superior thyroidal artery branch leading to the upper pharynx. All of these vessels were then embolized again using particle sizes of 250-300 microns, 350-500 microns, and 500-700 microns mixed with 50% contrast and 50% heparinized saline infusion. Prior to the embolizations, dangerous anastomosis were excluded by controlled gentle hand held arteriograms. Embolizations were performed until there was reflux or stasis within the selected arterial feeder. Special attention was given to ensure no retrograde reflux into the internal carotid artery. Following the final vessel embolization, the microcatheter was retrieved and removed. The 6 [place] MDP Brite tip Envoy guide catheter was retrieved into the right common carotid artery. A control arteriogram performed centered extra cranially and intracranially demonstrated no intraluminal filling defects or occlusions intracranially. There continued to be complete stasis of the vessels super selectively embolized. The combination of the 6 [place] neurovascular Arrow sheath, and the 6 French 90 cm MDP Brite tip Envoy guide catheter was then positioned as described above in the left external carotid artery. The guidewire was removed. Roadmap arteriogram was then performed in the biplane mode. Thereafter super selective cannulations and embolizations were then performed using a rapid transit 2 tip microcatheter over a 0.014 inch Softip Synchro micro guidewire into the left internal maxillary artery at the level of the left transverse facial artery, the left external carotid artery proximal to the origin of the internal  maxillary artery, the left  lingual artery, the left facial artery just proximal to the level of the origin of the mandibular branches. Each of these arterial feeders were super selectively cannulated followed by verified safe positioning of the tip of the microcatheter prior to the embolization with the 250-300 microns, 350-500 microns and 500-700 microns PVA particles mixed with 50% contrast and 50% heparinized saline infusion. Again embolizations were performed until there was complete stasis or reflux to the tip of the microcatheter. Dangerous communications were eliminated with selective arteriograms in each of these vessels prior to the embolizations. At the end of the final embolization, microcatheters were retrieved and removed. Copious back bleed and aspiration was carried out above the 6 [place] MDP Brite tip Envoy guide catheter. This was then positioned in the left common carotid artery. Control arteriogram performed centered intracranially and extra cranially demonstrated no intraluminal filling defects or occlusions in the intracranial compartment. There was complete angiographic stasis of contrast in the embolized left external carotid artery branches. The microcatheter was then retrieved and removed. The 6 [place] MDP Brite tip Envoy guide catheter was retrieved with the neurovascular sheath into the abdominal aorta and exchanged over a J-tip guidewire for 6 a [place] Pinnacle sheath. This in turn was then successfully replaced with the application of an external closure device successfully. The right groin appeared soft without evidence of a hematoma or bleeding. The distal pulses remained 2+ in the dorsalis pedis and in the posterior tibial regions bilaterally unchanged from prior to the procedure. The patient was then transported to the ICU for further medical management. At the end of the procedure the oropharynx appeared dry without evidence of bleeding. Also no bleeding was noted from the patient's nares. IMPRESSION: Status  post super selective embolization of left and right external carotid artery branches as described above using PVA particles of sizes 250-300 microns, 350-500 microns and 500-700 microns with complete stasis of the super selectively embolized vessels bilaterally as described above. PLAN: Patient transferred to the medical ICU for further medical management. INDICATION: Recurrent copious oropharyngeal hemorrhage. Patient with lingual resection for lingual carcinoma. Electronically Signed   By: Luanne Bras M.D.   On: 05/07/2017 23:07   Ir Angiogram Follow Up Study  Result Date: 05/08/2017 CLINICAL DATA:  Extensive oropharyngeal uncontrolled hemorrhage. Patient with history of left sided lingual squamous cell carcinoma. EXAM: BILATERAL COMMON CAROTID AND INNOMINATE ANGIOGRAPHY; IR ANGIO EXTERNAL CAROTID SEL EXT CAROTID BILAT MOD SED; IR NEURO EACH ADD'L AFTER BASIC UNI LEFT (MS); IR ANGIO VERTEBRAL SEL VERTEBRAL BILAT MOD SED; IR NEURO EACH ADD'L AFTER BASIC UNI RIGHT (MS); ARTERIOGRAPHY; TRANSCATHETER THERAPY EMBOLIZATION MEDICATIONS: Heparin 1000 units IV; Ancef 2 g IV antibiotic was administered within 1 hour of the procedure. ANESTHESIA/SEDATION: General anesthesia. CONTRAST:  Isovue 300 approximately 150 mL. FLUOROSCOPY TIME:  Fluoroscopy Time: 34 minutes 18 seconds (3429 mGy). COMPLICATIONS: Joel Mathis immediate. TECHNIQUE: Informed written consent was obtained from the patient after a thorough discussion of the procedural risks, benefits and alternatives. All questions were addressed. Maximal Sterile Barrier Technique was utilized including caps, mask, sterile gowns, sterile gloves, sterile drape, hand hygiene and skin antiseptic. A timeout was performed prior to the initiation of the procedure. The right groin was prepped and draped in the usual sterile fashion. Thereafter using modified Seldinger technique, transfemoral access into the right common femoral artery was obtained without difficulty. Over a  0.035 inch guidewire, a 5 [place] Pinnacle sheath was inserted. Through this, and also over 0.035 inch guidewire, a  5 [place] JB 1 catheter was advanced to the aortic arch region and selectively positioned in the right common carotid artery, the right external carotid artery, the right vertebral artery, the left common carotid artery, left external carotid artery and the left vertebral artery. FINDINGS: The right vertebral artery origin is widely patent. The vessel is seen to opacify normally to the cranial skull base. Wide patency is seen of the right vertebrobasilar junction and the right posterior-inferior cerebellar artery. The basilar artery, the posterior cerebral arteries, the superior cerebellar arteries and the anterior-inferior cerebellar arteries opacify normally into the capillary and venous phases. The right common carotid arteriogram demonstrates the right external carotid artery and its major branches to be widely patent. Abnormal prominence is seen of the right lingual artery, and right facial artery distally. The remaining branches appear to be of normal caliber. There is no acute extravasation of contrast or of mass-effect noted. The right internal carotid artery at the bulb to the cranial skull base opacifies normally. The petrous, cavernous and supraclinoid segments are widely patent. The right middle cerebral artery and the right anterior cerebral artery opacify normally into the capillary and venous phases. The left vertebral artery origin is widely patent. The vessel is seen to opacify normally to the cranial skull base. Wide patency is seen of the left vertebrobasilar junction and the left posterior inferior cerebellar artery. The visualized portion of the proximal half of the basilar artery including the anterior-inferior cerebellar arteries is grossly patent into delayed arterial phase. The left common carotid arteriogram demonstrates the left internal carotid artery and its major branches to  be widely patent. The petrous, cavernous and supraclinoid segments demonstrate wide patency. The left middle cerebral artery and left anterior cerebral artery opacify normally into the capillary and venous phases. A selective left external carotid arteriogram demonstrates abnormal prominence of the left lingual artery, the facial artery and the ascending pharyngeal artery. There is no acute extravasation of contrast noted on the images taken. ENDOVASCULAR SUPER SELECTIVE EMBOLIZATION OF THE LEFT AND RIGHT EXTERNAL CAROTID ARTERY BRANCHES NAMELY THE LEFT AND RIGHT LINGUAL ARTERIES, THE ASCENDING PHARYNGEAL ARTERY ON THE LEFT, AND THE MEDIAL PHARYNGEAL BRANCHES OF THE FACIAL ARTERY WITH PVA PARTICLES SIZES 300-500 MICRONS, AND 500-700 MICRONS. The diagnostic JB 1 catheter in the left common carotid artery was exchanged over a 0.035 inch 300 cm Rosen exchange guidewire for an 80 cm 6 [place] Cook shuttle sheath using biplane roadmap technique and constant fluoroscopic guidance. Good aspiration obtained from the hub of the [place] shuttle sheath. A gentle contrast injection demonstrated no evidence of spasms, dissections or of intraluminal filling defects. Over a 0.035 inch Roadrunner guidewire, using biplane roadmap technique and constant fluoroscopic guidance, the tip of the Cook shuttle sheath was then advanced into the proximal left external carotid artery just proximal to the origin of the superior thyroidal artery. Thereafter, using biplane roadmap technique and constant fluoroscopic guidance, in a coaxial manner and with constant heparinized saline infusion, a rapid transit 2 tip microcatheter was then advanced over 0.014 inch Softip Synchro micro guidewire to selectively position in the left lingual artery, the left facial artery distally, and also the ascending pharyngeal artery. These were selected consecutively. They were advanced over a 0.014 inch Softip Synchro micro guidewire to the point where  embolization was to begin. Prior to the embolization with the 300-500 micron particles, and 500-700 micro particles, control arteriograms were performed through the microcatheter to ensure safe position, and also to exclude dangerous  anastomosis with the left vertebral artery or the left internal carotid artery. Also opacification of the left ophthalmic artery with the ciliary blush was also monitored. Embolization in all these three vessels was continued under intermittent fluoroscopy until there was stasis and/or reflux. The microcatheter position was then retrieved proximally until safe embolization had been undertaken. At the end of the embolization of the left external carotid artery branches as described above, a control arteriogram performed through the [facility] shuttle sheath just proximal to the origin of the left external carotid artery demonstrated angiographic stasis in the ascending pharyngeal artery distally, the lingual artery in its distal 2/3, and the pharygneal and lingual branches arising from the facial artery. A control arteriogram performed intracranially demonstrated no evidence of intraluminal filling defects or of occlusions. In a similar fashion, the right external carotid artery was accessed through the 6 [place] Arrow sheath positioned in the right common carotid artery over an exchange guidewire. A MDP soft tip 6 [place] guide catheter was engaged at the origin of the right external carotid artery. Again with biplane roadmap technique and constant fluoroscopic guidance, in a coaxial manner and with constant heparinized saline infusion access was obtained with the rapid transit 2 tip microcatheter over a 0.014 inch Softip Synchro micro guidewire into the right lingual artery, and the right facial artery just proximal to the floor of the tongue branches. Again after having eliminated dangerous anastomosis, embolization was performed as described above with PVA particles 500-700 microns, and  300-500 microns until there was stasis and/or reflux. Control was obtained of the right external carotid origin to prevent reflux into the internal carotid artery. At the end of the embolization, a control arteriogram performed through the 6 [place] MDP Brite tip guide catheter again demonstrated complete stasis within the right lingual artery and also the facial branches. Intracranially no evidence of intraluminal filling defects or of occlusions were seen. Throughout the procedure, the patient's blood pressure and neurological status remained stable. The patient was given approximately 2000 units of Heparin IV. The 6 [place] Arrow neurovascular sheath was then retrieved with the 6 [place] Brite tip MDP Envoy guide catheter into the abdominal aorta and exchanged over a J-tip guidewire for a 6 [place] Pinnacle sheath. This in turn was then removed with the application of an external closure device. The right groin appeared soft without evidence of hematoma. Distal pulses remained 2+ in the dorsalis pedis and the posterior tibial arteries. At the end of the procedure, the patient was then transported to the neuro ICU to undergo further evaluation. IMPRESSION: Status post endovascular super selective embolization of external carotid artery branches, the left lingual artery, the right lingual artery, the floor of the mouth branches arising from the facial arteries bilaterally, and the left ascending pharyngeal artery with PVA particles sizes 300-500 microns, and 500-700 microns. PLAN: The patient was transferred to ICU for medical and hemodynamic stabilization. Electronically Signed   By: Luanne Bras M.D.   On: 05/07/2017 21:25   Ir Angiogram Follow Up Study  Result Date: 05/08/2017 CLINICAL DATA:  Recurrent copious oropharyngeal hemorrhage. Patient with lingual carcinoma. EXAM: BILATERAL COMMON CAROTID AND INNOMINATE ANGIOGRAPHY; IR NEURO EACH ADD'L AFTER BASIC UNI LEFT (MS); TRANSCATHETER THERAPY  EMBOLIZATION; IR ANGIO EXTERNAL CAROTID SEL EXT CAROTID BILAT MOD SED; IR NEURO EACH ADD'L AFTER BASIC UNI RIGHT (MS); ARTERIOGRAPHY COMPARISON:  Diagnostic catheter arteriogram of 05/03/2017 earlier in the day. MEDICATIONS: Heparin 3,000 units IV; Ancef 2 g IV antibiotic was administered within 1 hour of  the procedure. ANESTHESIA/SEDATION: General anesthesia. CONTRAST:  Isovue 300 250 cc. FLUOROSCOPY TIME:  Fluoroscopy Time: 51 minutes 18 seconds (3795 mGy). COMPLICATIONS: Joel Mathis immediate. TECHNIQUE: Informed written consent was obtained from the patient's mother after a thorough discussion of the procedural risks, benefits and alternatives. All questions were addressed. Maximal Sterile Barrier Technique was utilized including caps, mask, sterile gowns, sterile gloves, sterile drape, hand hygiene and skin antiseptic. A timeout was performed prior to the initiation of the procedure. The right groin was prepped and draped in the usual sterile fashion. Thereafter using modified Seldinger technique, transfemoral access into the right common femoral artery was obtained without difficulty. Over a 0.035 inch guidewire, a 5 French Pinnacle sheath was inserted. Through this, and also over 0.035 inch guidewire, a 5 [place] JB 1 catheter was advanced to the aortic arch region and selectively positioned in the right common carotid artery, the right external carotid artery, , the left common carotid artery and the left external carotid artery. FINDINGS: TREATMENT The right common carotid arteriogram again demonstrates the right internal carotid artery at the bulb to the cranial skull base to opacify normally. The petrous, cavernous and supraclinoid segments demonstrate wide patency. The right middle cerebral artery and the right anterior cerebral artery opacify normally into the capillary and venous phases. The right external carotid arteriogram demonstrated prominence of the lingual artery, the facial artery, the superior  thyroidal artery, and the internal maxillary artery at the level of the right transverse facial artery. The diagnostic JB 1 catheter was then exchanged over a 0.035 inch 300 cm Rosen exchange guidewire for a 6 French 65 cm Arrow neurovascular sheath using biplane roadmap technique and constant fluoroscopic guidance. Good aspiration obtained from the hub of the neurovascular Arrow sheath. Gentle contrast injection demonstrated no evidence of spasms, dissections or of intraluminal filling defects. This was then connected to continuous heparinized saline infusion. Over a [employer] guidewire, a 6 [place] MDP 90 cm Envoy guide catheter was then advanced and positioned just proximal to the right common carotid bifurcation. The guidewire was removed. Good aspiration obtained from the hub of the MDP guide catheter. A gentle contrast injection demonstrated no evidence of spasms, dissections or of intraluminal filling defects. Over a 0.035 inch Roadrunner guidewire, the tip of the MDP guide catheter was then advanced and positioned in the right external carotid artery just distal to the origin of the lingual artery. The guidewire was removed. Good aspiration obtained from the hub of the 6 [place] MDP guide catheter. A control arteriogram was then performed with roadmap technique. Thereafter, a rapid transit 2 tip microcatheter was then advanced over a 0.014 inch Softip Synchro micro guidewire to the distal end of the microcatheter. The guidewire was removed. Good aspiration obtained from hub of the microcatheter. A gentle control arteriogram performed through the microcatheter demonstrated no dangerous abnormal communication between the internal carotid on the right side and and the right vertebral artery. Thereafter, the tip of the microcatheter in the internal maxillary artery on the right just proximal to the origin of the transverse facial artery, progressive embolization was then performed using PVA particles of  sizes 250-300 microns, 350-500 microns, and 500-700 microns. These were mixed in with 50% contrast and 50% heparinized saline infusion. Control embolization was performed with biplane intermittent fluoroscopy until there was stasis and/or reflux the tip of the microcatheter. Subsequent embolizations super selectively were then performed again using the rapid transit microcatheter with super selective cannulations of the right lingual artery, the  right facial artery just proximal to the origin of the anterior mandibular branches, and also the superior thyroidal artery branch leading to the upper pharynx. All of these vessels were then embolized again using particle sizes of 250-300 microns, 350-500 microns, and 500-700 microns mixed with 50% contrast and 50% heparinized saline infusion. Prior to the embolizations, dangerous anastomosis were excluded by controlled gentle hand held arteriograms. Embolizations were performed until there was reflux or stasis within the selected arterial feeder. Special attention was given to ensure no retrograde reflux into the internal carotid artery. Following the final vessel embolization, the microcatheter was retrieved and removed. The 6 [place] MDP Brite tip Envoy guide catheter was retrieved into the right common carotid artery. A control arteriogram performed centered extra cranially and intracranially demonstrated no intraluminal filling defects or occlusions intracranially. There continued to be complete stasis of the vessels super selectively embolized. The combination of the 6 [place] neurovascular Arrow sheath, and the 6 [place] 90 cm MDP Brite tip Envoy guide catheter was then positioned as described above in the left external carotid artery. The guidewire was removed. Roadmap arteriogram was then performed in the biplane mode. Thereafter super selective cannulations and embolizations were then performed using a rapid transit 2 tip microcatheter over a 0.014 inch Softip  Synchro micro guidewire into the left internal maxillary artery at the level of the left transverse facial artery, the left external carotid artery proximal to the origin of the internal maxillary artery, the left lingual artery, the left facial artery just proximal to the level of the origin of the mandibular branches. Each of these arterial feeders were super selectively cannulated followed by verified safe positioning of the tip of the microcatheter prior to the embolization with the 250-300 microns, 350-500 microns and 500-700 microns PVA particles mixed with 50% contrast and 50% heparinized saline infusion. Again embolizations were performed until there was complete stasis or reflux to the tip of the microcatheter. Dangerous communications were eliminated with selective arteriograms in each of these vessels prior to the embolizations. At the end of the final embolization, microcatheters were retrieved and removed. Copious back bleed and aspiration was carried out above the 6 [place] MDP Brite tip Envoy guide catheter. This was then positioned in the left common carotid artery. Control arteriogram performed centered intracranially and extra cranially demonstrated no intraluminal filling defects or occlusions in the intracranial compartment. There was complete angiographic stasis of contrast in the embolized left external carotid artery branches. The microcatheter was then retrieved and removed. The 6 [place] MDP Brite tip Envoy guide catheter was retrieved with the neurovascular sheath into the abdominal aorta and exchanged over a J-tip guidewire for 6 a [place] Pinnacle sheath. This in turn was then successfully replaced with the application of an external closure device successfully. The right groin appeared soft without evidence of a hematoma or bleeding. The distal pulses remained 2+ in the dorsalis pedis and in the posterior tibial regions bilaterally unchanged from prior to the procedure. The patient was then  transported to the ICU for further medical management. At the end of the procedure the oropharynx appeared dry without evidence of bleeding. Also no bleeding was noted from the patient's nares. IMPRESSION: Status post super selective embolization of left and right external carotid artery branches as described above using PVA particles of sizes 250-300 microns, 350-500 microns and 500-700 microns with complete stasis of the super selectively embolized vessels bilaterally as described above. PLAN: Patient transferred to the medical ICU for further medical management.  INDICATION: Recurrent copious oropharyngeal hemorrhage. Patient with lingual resection for lingual carcinoma. Electronically Signed   By: Luanne Bras M.D.   On: 05/07/2017 23:07   Dg Chest Port 1 View  Result Date: 05/08/2017 CLINICAL DATA:  Shortness of breath. EXAM: PORTABLE CHEST 1 VIEW COMPARISON:  Radiograph of May 05, 2017. FINDINGS: Stable cardiomediastinal silhouette. No pneumothorax or pleural effusion is noted. Right lung is clear. Endotracheal tube has been removed. Minimal left basilar subsegmental atelectasis is noted. Bony thorax is unremarkable. IMPRESSION: Minimal left basilar subsegmental atelectasis. Electronically Signed   By: Marijo Conception, M.D.   On: 05/08/2017 07:58   Dg Chest Port 1 View  Result Date: 05/05/2017 CLINICAL DATA:  36 year old with male status post intubation. EXAM: PORTABLE CHEST 1 VIEW COMPARISON:  05/04/2017 FINDINGS: Endotracheal tube again terminates approximately 5 cm above the level of the carina. Cardiomediastinal silhouette is unchanged. Note is made of low lung volumes with bronchovascular crowding. No focal consolidation, sizable effusion or pneumothorax. No acute osseous abnormalities. IMPRESSION: Stable appearance of the chest with endotracheal tube in place. Persistent low lung volumes and bibasilar atelectasis. Electronically Signed   By: Kristopher Oppenheim M.D.   On: 05/05/2017 07:39    Dg Chest Port 1 View  Result Date: 05/04/2017 CLINICAL DATA:  36 year old male with respiratory failure EXAM: PORTABLE CHEST 1 VIEW COMPARISON:  Prior chest x-ray 05/03/2017 FINDINGS: Patient remains intubated. The tip of the endotracheal tube is 5.3 cm above the carina. Low lung volumes with mild bibasilar atelectasis. The lungs are otherwise clear. Cardiac mediastinal contours remain unchanged. No acute osseous abnormality. IMPRESSION: 1. Stable position of endotracheal tube. 2. Persistent low lung volumes with bibasilar atelectasis. Electronically Signed   By: Jacqulynn Cadet M.D.   On: 05/04/2017 08:57   Dg Chest Port 1 View  Result Date: 05/03/2017 CLINICAL DATA:  Central line placement. EXAM: PORTABLE CHEST 1 VIEW COMPARISON:  Joel Mathis. FINDINGS: Endotracheal tube in place with the tip approximately 2.9 cm above the level of the carina. No central venous catheter visualized. The cardiomediastinal silhouette is normal in size. Normal pulmonary vascularity. Low lung volumes. No focal consolidation, pleural effusion, or pneumothorax. No acute osseous abnormality. IMPRESSION: 1. No central line visualized. Please note the indication for the study is central line placement. 2. Endotracheal tube in appropriate position. No active cardiopulmonary disease. Electronically Signed   By: Titus Dubin M.D.   On: 05/03/2017 13:42   Ir Angio Intra Extracran Sel Com Carotid Innominate Bilat Mod Sed  Result Date: 05/08/2017 CLINICAL DATA:  Extensive oropharyngeal uncontrolled hemorrhage. Patient with history of left sided lingual squamous cell carcinoma. EXAM: BILATERAL COMMON CAROTID AND INNOMINATE ANGIOGRAPHY; IR ANGIO EXTERNAL CAROTID SEL EXT CAROTID BILAT MOD SED; IR NEURO EACH ADD'L AFTER BASIC UNI LEFT (MS); IR ANGIO VERTEBRAL SEL VERTEBRAL BILAT MOD SED; IR NEURO EACH ADD'L AFTER BASIC UNI RIGHT (MS); ARTERIOGRAPHY; TRANSCATHETER THERAPY EMBOLIZATION MEDICATIONS: Heparin 1000 units IV; Ancef 2 g IV  antibiotic was administered within 1 hour of the procedure. ANESTHESIA/SEDATION: General anesthesia. CONTRAST:  Isovue 300 approximately 150 mL. FLUOROSCOPY TIME:  Fluoroscopy Time: 34 minutes 18 seconds (3429 mGy). COMPLICATIONS: Joel Mathis immediate. TECHNIQUE: Informed written consent was obtained from the patient after a thorough discussion of the procedural risks, benefits and alternatives. All questions were addressed. Maximal Sterile Barrier Technique was utilized including caps, mask, sterile gowns, sterile gloves, sterile drape, hand hygiene and skin antiseptic. A timeout was performed prior to the initiation of the procedure. The right groin was prepped and  draped in the usual sterile fashion. Thereafter using modified Seldinger technique, transfemoral access into the right common femoral artery was obtained without difficulty. Over a 0.035 inch guidewire, a 5 [place] Pinnacle sheath was inserted. Through this, and also over 0.035 inch guidewire, a 5 [place] JB 1 catheter was advanced to the aortic arch region and selectively positioned in the right common carotid artery, the right external carotid artery, the right vertebral artery, the left common carotid artery, left external carotid artery and the left vertebral artery. FINDINGS: The right vertebral artery origin is widely patent. The vessel is seen to opacify normally to the cranial skull base. Wide patency is seen of the right vertebrobasilar junction and the right posterior-inferior cerebellar artery. The basilar artery, the posterior cerebral arteries, the superior cerebellar arteries and the anterior-inferior cerebellar arteries opacify normally into the capillary and venous phases. The right common carotid arteriogram demonstrates the right external carotid artery and its major branches to be widely patent. Abnormal prominence is seen of the right lingual artery, and right facial artery distally. The remaining branches appear to be of normal caliber.  There is no acute extravasation of contrast or of mass-effect noted. The right internal carotid artery at the bulb to the cranial skull base opacifies normally. The petrous, cavernous and supraclinoid segments are widely patent. The right middle cerebral artery and the right anterior cerebral artery opacify normally into the capillary and venous phases. The left vertebral artery origin is widely patent. The vessel is seen to opacify normally to the cranial skull base. Wide patency is seen of the left vertebrobasilar junction and the left posterior inferior cerebellar artery. The visualized portion of the proximal half of the basilar artery including the anterior-inferior cerebellar arteries is grossly patent into delayed arterial phase. The left common carotid arteriogram demonstrates the left internal carotid artery and its major branches to be widely patent. The petrous, cavernous and supraclinoid segments demonstrate wide patency. The left middle cerebral artery and left anterior cerebral artery opacify normally into the capillary and venous phases. A selective left external carotid arteriogram demonstrates abnormal prominence of the left lingual artery, the facial artery and the ascending pharyngeal artery. There is no acute extravasation of contrast noted on the images taken. ENDOVASCULAR SUPER SELECTIVE EMBOLIZATION OF THE LEFT AND RIGHT EXTERNAL CAROTID ARTERY BRANCHES NAMELY THE LEFT AND RIGHT LINGUAL ARTERIES, THE ASCENDING PHARYNGEAL ARTERY ON THE LEFT, AND THE MEDIAL PHARYNGEAL BRANCHES OF THE FACIAL ARTERY WITH PVA PARTICLES SIZES 300-500 MICRONS, AND 500-700 MICRONS. The diagnostic JB 1 catheter in the left common carotid artery was exchanged over a 0.035 inch 300 cm Rosen exchange guidewire for an 80 cm 6 [place] Cook shuttle sheath using biplane roadmap technique and constant fluoroscopic guidance. Good aspiration obtained from the hub of the [place] shuttle sheath. A gentle contrast injection  demonstrated no evidence of spasms, dissections or of intraluminal filling defects. Over a 0.035 inch Roadrunner guidewire, using biplane roadmap technique and constant fluoroscopic guidance, the tip of the Cook shuttle sheath was then advanced into the proximal left external carotid artery just proximal to the origin of the superior thyroidal artery. Thereafter, using biplane roadmap technique and constant fluoroscopic guidance, in a coaxial manner and with constant heparinized saline infusion, a rapid transit 2 tip microcatheter was then advanced over 0.014 inch Softip Synchro micro guidewire to selectively position in the left lingual artery, the left facial artery distally, and also the ascending pharyngeal artery. These were selected consecutively. They were advanced over  a 0.014 inch Softip Synchro micro guidewire to the point where embolization was to begin. Prior to the embolization with the 300-500 micron particles, and 500-700 micro particles, control arteriograms were performed through the microcatheter to ensure safe position, and also to exclude dangerous anastomosis with the left vertebral artery or the left internal carotid artery. Also opacification of the left ophthalmic artery with the ciliary blush was also monitored. Embolization in all these three vessels was continued under intermittent fluoroscopy until there was stasis and/or reflux. The microcatheter position was then retrieved proximally until safe embolization had been undertaken. At the end of the embolization of the left external carotid artery branches as described above, a control arteriogram performed through the [facility] shuttle sheath just proximal to the origin of the left external carotid artery demonstrated angiographic stasis in the ascending pharyngeal artery distally, the lingual artery in its distal 2/3, and the pharygneal and lingual branches arising from the facial artery. A control arteriogram performed intracranially  demonstrated no evidence of intraluminal filling defects or of occlusions. In a similar fashion, the right external carotid artery was accessed through the 6 [place] Arrow sheath positioned in the right common carotid artery over an exchange guidewire. A MDP soft tip 6 [place] guide catheter was engaged at the origin of the right external carotid artery. Again with biplane roadmap technique and constant fluoroscopic guidance, in a coaxial manner and with constant heparinized saline infusion access was obtained with the rapid transit 2 tip microcatheter over a 0.014 inch Softip Synchro micro guidewire into the right lingual artery, and the right facial artery just proximal to the floor of the tongue branches. Again after having eliminated dangerous anastomosis, embolization was performed as described above with PVA particles 500-700 microns, and 300-500 microns until there was stasis and/or reflux. Control was obtained of the right external carotid origin to prevent reflux into the internal carotid artery. At the end of the embolization, a control arteriogram performed through the 6 [place] MDP Brite tip guide catheter again demonstrated complete stasis within the right lingual artery and also the facial branches. Intracranially no evidence of intraluminal filling defects or of occlusions were seen. Throughout the procedure, the patient's blood pressure and neurological status remained stable. The patient was given approximately 2000 units of Heparin IV. The 6 [place] Arrow neurovascular sheath was then retrieved with the 6 [place] Brite tip MDP Envoy guide catheter into the abdominal aorta and exchanged over a J-tip guidewire for a 6 [place] Pinnacle sheath. This in turn was then removed with the application of an external closure device. The right groin appeared soft without evidence of hematoma. Distal pulses remained 2+ in the dorsalis pedis and the posterior tibial arteries. At the end of the procedure, the patient  was then transported to the neuro ICU to undergo further evaluation. IMPRESSION: Status post endovascular super selective embolization of external carotid artery branches, the left lingual artery, the right lingual artery, the floor of the mouth branches arising from the facial arteries bilaterally, and the left ascending pharyngeal artery with PVA particles sizes 300-500 microns, and 500-700 microns. PLAN: The patient was transferred to ICU for medical and hemodynamic stabilization. Electronically Signed   By: Luanne Bras M.D.   On: 05/07/2017 21:25   Ir Angio Intra Extracran Sel Com Carotid Innominate Bilat Mod Sed  Result Date: 05/08/2017 CLINICAL DATA:  Recurrent copious oropharyngeal hemorrhage. Patient with lingual carcinoma. EXAM: BILATERAL COMMON CAROTID AND INNOMINATE ANGIOGRAPHY; IR NEURO EACH ADD'L AFTER BASIC UNI LEFT (MS);  TRANSCATHETER THERAPY EMBOLIZATION; IR ANGIO EXTERNAL CAROTID SEL EXT CAROTID BILAT MOD SED; IR NEURO EACH ADD'L AFTER BASIC UNI RIGHT (MS); ARTERIOGRAPHY COMPARISON:  Diagnostic catheter arteriogram of 05/03/2017 earlier in the day. MEDICATIONS: Heparin 3,000 units IV; Ancef 2 g IV antibiotic was administered within 1 hour of the procedure. ANESTHESIA/SEDATION: General anesthesia. CONTRAST:  Isovue 300 250 cc. FLUOROSCOPY TIME:  Fluoroscopy Time: 51 minutes 18 seconds (3795 mGy). COMPLICATIONS: Joel Mathis immediate. TECHNIQUE: Informed written consent was obtained from the patient's mother after a thorough discussion of the procedural risks, benefits and alternatives. All questions were addressed. Maximal Sterile Barrier Technique was utilized including caps, mask, sterile gowns, sterile gloves, sterile drape, hand hygiene and skin antiseptic. A timeout was performed prior to the initiation of the procedure. The right groin was prepped and draped in the usual sterile fashion. Thereafter using modified Seldinger technique, transfemoral access into the right common femoral artery  was obtained without difficulty. Over a 0.035 inch guidewire, a 5 [place] Pinnacle sheath was inserted. Through this, and also over 0.035 inch guidewire, a 5 [place] JB 1 catheter was advanced to the aortic arch region and selectively positioned in the right common carotid artery, the right external carotid artery, , the left common carotid artery and the left external carotid artery. FINDINGS: TREATMENT The right common carotid arteriogram again demonstrates the right internal carotid artery at the bulb to the cranial skull base to opacify normally. The petrous, cavernous and supraclinoid segments demonstrate wide patency. The right middle cerebral artery and the right anterior cerebral artery opacify normally into the capillary and venous phases. The right external carotid arteriogram demonstrated prominence of the lingual artery, the facial artery, the superior thyroidal artery, and the internal maxillary artery at the level of the right transverse facial artery. The diagnostic JB 1 catheter was then exchanged over a 0.035 inch 300 cm Rosen exchange guidewire for a 6 [place] 65 cm Arrow neurovascular sheath using biplane roadmap technique and constant fluoroscopic guidance. Good aspiration obtained from the hub of the neurovascular Arrow sheath. Gentle contrast injection demonstrated no evidence of spasms, dissections or of intraluminal filling defects. This was then connected to continuous heparinized saline infusion. Over a [employer] guidewire, a 6 [place] MDP 90 cm Envoy guide catheter was then advanced and positioned just proximal to the right common carotid bifurcation. The guidewire was removed. Good aspiration obtained from the hub of the MDP guide catheter. A gentle contrast injection demonstrated no evidence of spasms, dissections or of intraluminal filling defects. Over a 0.035 inch Roadrunner guidewire, the tip of the MDP guide catheter was then advanced and positioned in the right external carotid  artery just distal to the origin of the lingual artery. The guidewire was removed. Good aspiration obtained from the hub of the 6 [place] MDP guide catheter. A control arteriogram was then performed with roadmap technique. Thereafter, a rapid transit 2 tip microcatheter was then advanced over a 0.014 inch Softip Synchro micro guidewire to the distal end of the microcatheter. The guidewire was removed. Good aspiration obtained from hub of the microcatheter. A gentle control arteriogram performed through the microcatheter demonstrated no dangerous abnormal communication between the internal carotid on the right side and and the right vertebral artery. Thereafter, the tip of the microcatheter in the internal maxillary artery on the right just proximal to the origin of the transverse facial artery, progressive embolization was then performed using PVA particles of sizes 250-300 microns, 350-500 microns, and 500-700 microns. These were mixed in  with 50% contrast and 50% heparinized saline infusion. Control embolization was performed with biplane intermittent fluoroscopy until there was stasis and/or reflux the tip of the microcatheter. Subsequent embolizations super selectively were then performed again using the rapid transit microcatheter with super selective cannulations of the right lingual artery, the right facial artery just proximal to the origin of the anterior mandibular branches, and also the superior thyroidal artery branch leading to the upper pharynx. All of these vessels were then embolized again using particle sizes of 250-300 microns, 350-500 microns, and 500-700 microns mixed with 50% contrast and 50% heparinized saline infusion. Prior to the embolizations, dangerous anastomosis were excluded by controlled gentle hand held arteriograms. Embolizations were performed until there was reflux or stasis within the selected arterial feeder. Special attention was given to ensure no retrograde reflux into  the internal carotid artery. Following the final vessel embolization, the microcatheter was retrieved and removed. The 6 [place] MDP Brite tip Envoy guide catheter was retrieved into the right common carotid artery. A control arteriogram performed centered extra cranially and intracranially demonstrated no intraluminal filling defects or occlusions intracranially. There continued to be complete stasis of the vessels super selectively embolized. The combination of the 6 [place] neurovascular Arrow sheath, and the 6 [place] 90 cm MDP Brite tip Envoy guide catheter was then positioned as described above in the left external carotid artery. The guidewire was removed. Roadmap arteriogram was then performed in the biplane mode. Thereafter super selective cannulations and embolizations were then performed using a rapid transit 2 tip microcatheter over a 0.014 inch Softip Synchro micro guidewire into the left internal maxillary artery at the level of the left transverse facial artery, the left external carotid artery proximal to the origin of the internal maxillary artery, the left lingual artery, the left facial artery just proximal to the level of the origin of the mandibular branches. Each of these arterial feeders were super selectively cannulated followed by verified safe positioning of the tip of the microcatheter prior to the embolization with the 250-300 microns, 350-500 microns and 500-700 microns PVA particles mixed with 50% contrast and 50% heparinized saline infusion. Again embolizations were performed until there was complete stasis or reflux to the tip of the microcatheter. Dangerous communications were eliminated with selective arteriograms in each of these vessels prior to the embolizations. At the end of the final embolization, microcatheters were retrieved and removed. Copious back bleed and aspiration was carried out above the 6 [place] MDP Brite tip Envoy guide catheter. This was then positioned in the left  common carotid artery. Control arteriogram performed centered intracranially and extra cranially demonstrated no intraluminal filling defects or occlusions in the intracranial compartment. There was complete angiographic stasis of contrast in the embolized left external carotid artery branches. The microcatheter was then retrieved and removed. The 6 [place] MDP Brite tip Envoy guide catheter was retrieved with the neurovascular sheath into the abdominal aorta and exchanged over a J-tip guidewire for 6 a [place] Pinnacle sheath. This in turn was then successfully replaced with the application of an external closure device successfully. The right groin appeared soft without evidence of a hematoma or bleeding. The distal pulses remained 2+ in the dorsalis pedis and in the posterior tibial regions bilaterally unchanged from prior to the procedure. The patient was then transported to the ICU for further medical management. At the end of the procedure the oropharynx appeared dry without evidence of bleeding. Also no bleeding was noted from the patient's nares. IMPRESSION: Status post  super selective embolization of left and right external carotid artery branches as described above using PVA particles of sizes 250-300 microns, 350-500 microns and 500-700 microns with complete stasis of the super selectively embolized vessels bilaterally as described above. PLAN: Patient transferred to the medical ICU for further medical management. INDICATION: Recurrent copious oropharyngeal hemorrhage. Patient with lingual resection for lingual carcinoma. Electronically Signed   By: Luanne Bras M.D.   On: 05/07/2017 23:07   Ir Angio Vertebral Sel Vertebral Bilat Mod Sed  Result Date: 05/08/2017 CLINICAL DATA:  Extensive oropharyngeal uncontrolled hemorrhage. Patient with history of left sided lingual squamous cell carcinoma. EXAM: BILATERAL COMMON CAROTID AND INNOMINATE ANGIOGRAPHY; IR ANGIO EXTERNAL CAROTID SEL EXT CAROTID  BILAT MOD SED; IR NEURO EACH ADD'L AFTER BASIC UNI LEFT (MS); IR ANGIO VERTEBRAL SEL VERTEBRAL BILAT MOD SED; IR NEURO EACH ADD'L AFTER BASIC UNI RIGHT (MS); ARTERIOGRAPHY; TRANSCATHETER THERAPY EMBOLIZATION MEDICATIONS: Heparin 1000 units IV; Ancef 2 g IV antibiotic was administered within 1 hour of the procedure. ANESTHESIA/SEDATION: General anesthesia. CONTRAST:  Isovue 300 approximately 150 mL. FLUOROSCOPY TIME:  Fluoroscopy Time: 34 minutes 18 seconds (3429 mGy). COMPLICATIONS: Joel Mathis immediate. TECHNIQUE: Informed written consent was obtained from the patient after a thorough discussion of the procedural risks, benefits and alternatives. All questions were addressed. Maximal Sterile Barrier Technique was utilized including caps, mask, sterile gowns, sterile gloves, sterile drape, hand hygiene and skin antiseptic. A timeout was performed prior to the initiation of the procedure. The right groin was prepped and draped in the usual sterile fashion. Thereafter using modified Seldinger technique, transfemoral access into the right common femoral artery was obtained without difficulty. Over a 0.035 inch guidewire, a 5 [place] Pinnacle sheath was inserted. Through this, and also over 0.035 inch guidewire, a 5 [place] JB 1 catheter was advanced to the aortic arch region and selectively positioned in the right common carotid artery, the right external carotid artery, the right vertebral artery, the left common carotid artery, left external carotid artery and the left vertebral artery. FINDINGS: The right vertebral artery origin is widely patent. The vessel is seen to opacify normally to the cranial skull base. Wide patency is seen of the right vertebrobasilar junction and the right posterior-inferior cerebellar artery. The basilar artery, the posterior cerebral arteries, the superior cerebellar arteries and the anterior-inferior cerebellar arteries opacify normally into the capillary and venous phases. The right common  carotid arteriogram demonstrates the right external carotid artery and its major branches to be widely patent. Abnormal prominence is seen of the right lingual artery, and right facial artery distally. The remaining branches appear to be of normal caliber. There is no acute extravasation of contrast or of mass-effect noted. The right internal carotid artery at the bulb to the cranial skull base opacifies normally. The petrous, cavernous and supraclinoid segments are widely patent. The right middle cerebral artery and the right anterior cerebral artery opacify normally into the capillary and venous phases. The left vertebral artery origin is widely patent. The vessel is seen to opacify normally to the cranial skull base. Wide patency is seen of the left vertebrobasilar junction and the left posterior inferior cerebellar artery. The visualized portion of the proximal half of the basilar artery including the anterior-inferior cerebellar arteries is grossly patent into delayed arterial phase. The left common carotid arteriogram demonstrates the left internal carotid artery and its major branches to be widely patent. The petrous, cavernous and supraclinoid segments demonstrate wide patency. The left middle cerebral artery and left anterior cerebral artery  opacify normally into the capillary and venous phases. A selective left external carotid arteriogram demonstrates abnormal prominence of the left lingual artery, the facial artery and the ascending pharyngeal artery. There is no acute extravasation of contrast noted on the images taken. ENDOVASCULAR SUPER SELECTIVE EMBOLIZATION OF THE LEFT AND RIGHT EXTERNAL CAROTID ARTERY BRANCHES NAMELY THE LEFT AND RIGHT LINGUAL ARTERIES, THE ASCENDING PHARYNGEAL ARTERY ON THE LEFT, AND THE MEDIAL PHARYNGEAL BRANCHES OF THE FACIAL ARTERY WITH PVA PARTICLES SIZES 300-500 MICRONS, AND 500-700 MICRONS. The diagnostic JB 1 catheter in the left common carotid artery was exchanged over a  0.035 inch 300 cm Rosen exchange guidewire for an 80 cm 6 [place] Cook shuttle sheath using biplane roadmap technique and constant fluoroscopic guidance. Good aspiration obtained from the hub of the [place] shuttle sheath. A gentle contrast injection demonstrated no evidence of spasms, dissections or of intraluminal filling defects. Over a 0.035 inch Roadrunner guidewire, using biplane roadmap technique and constant fluoroscopic guidance, the tip of the [place] shuttle sheath was then advanced into the proximal left external carotid artery just proximal to the origin of the superior thyroidal artery. Thereafter, using biplane roadmap technique and constant fluoroscopic guidance, in a coaxial manner and with constant heparinized saline infusion, a rapid transit 2 tip microcatheter was then advanced over 0.014 inch Softip Synchro micro guidewire to selectively position in the left lingual artery, the left facial artery distally, and also the ascending pharyngeal artery. These were selected consecutively. They were advanced over a 0.014 inch Softip Synchro micro guidewire to the point where embolization was to begin. Prior to the embolization with the 300-500 micron particles, and 500-700 micro particles, control arteriograms were performed through the microcatheter to ensure safe position, and also to exclude dangerous anastomosis with the left vertebral artery or the left internal carotid artery. Also opacification of the left ophthalmic artery with the ciliary blush was also monitored. Embolization in all these three vessels was continued under intermittent fluoroscopy until there was stasis and/or reflux. The microcatheter position was then retrieved proximally until safe embolization had been undertaken. At the end of the embolization of the left external carotid artery branches as described above, a control arteriogram performed through the [facility] shuttle sheath just proximal to the origin of the left external  carotid artery demonstrated angiographic stasis in the ascending pharyngeal artery distally, the lingual artery in its distal 2/3, and the pharygneal and lingual branches arising from the facial artery. A control arteriogram performed intracranially demonstrated no evidence of intraluminal filling defects or of occlusions. In a similar fashion, the right external carotid artery was accessed through the 6 [place] Arrow sheath positioned in the right common carotid artery over an exchange guidewire. A MDP soft tip 6 [place] guide catheter was engaged at the origin of the right external carotid artery. Again with biplane roadmap technique and constant fluoroscopic guidance, in a coaxial manner and with constant heparinized saline infusion access was obtained with the rapid transit 2 tip microcatheter over a 0.014 inch Softip Synchro micro guidewire into the right lingual artery, and the right facial artery just proximal to the floor of the tongue branches. Again after having eliminated dangerous anastomosis, embolization was performed as described above with PVA particles 500-700 microns, and 300-500 microns until there was stasis and/or reflux. Control was obtained of the right external carotid origin to prevent reflux into the internal carotid artery. At the end of the embolization, a control arteriogram performed through the 6 [place] MDP Brite tip  guide catheter again demonstrated complete stasis within the right lingual artery and also the facial branches. Intracranially no evidence of intraluminal filling defects or of occlusions were seen. Throughout the procedure, the patient's blood pressure and neurological status remained stable. The patient was given approximately 2000 units of Heparin IV. The 6 [place] Arrow neurovascular sheath was then retrieved with the 6 [place] Brite tip MDP Envoy guide catheter into the abdominal aorta and exchanged over a J-tip guidewire for a 6 [place] Pinnacle sheath. This in turn was  then removed with the application of an external closure device. The right groin appeared soft without evidence of hematoma. Distal pulses remained 2+ in the dorsalis pedis and the posterior tibial arteries. At the end of the procedure, the patient was then transported to the neuro ICU to undergo further evaluation. IMPRESSION: Status post endovascular super selective embolization of external carotid artery branches, the left lingual artery, the right lingual artery, the floor of the mouth branches arising from the facial arteries bilaterally, and the left ascending pharyngeal artery with PVA particles sizes 300-500 microns, and 500-700 microns. PLAN: The patient was transferred to ICU for medical and hemodynamic stabilization. Electronically Signed   By: Luanne Bras M.D.   On: 05/07/2017 21:25   Ir Angio External Carotid Sel Ext Carotid Bilat Mod Sed  Result Date: 05/08/2017 CLINICAL DATA:  Extensive oropharyngeal uncontrolled hemorrhage. Patient with history of left sided lingual squamous cell carcinoma. EXAM: BILATERAL COMMON CAROTID AND INNOMINATE ANGIOGRAPHY; IR ANGIO EXTERNAL CAROTID SEL EXT CAROTID BILAT MOD SED; IR NEURO EACH ADD'L AFTER BASIC UNI LEFT (MS); IR ANGIO VERTEBRAL SEL VERTEBRAL BILAT MOD SED; IR NEURO EACH ADD'L AFTER BASIC UNI RIGHT (MS); ARTERIOGRAPHY; TRANSCATHETER THERAPY EMBOLIZATION MEDICATIONS: Heparin 1000 units IV; Ancef 2 g IV antibiotic was administered within 1 hour of the procedure. ANESTHESIA/SEDATION: General anesthesia. CONTRAST:  Isovue 300 approximately 150 mL. FLUOROSCOPY TIME:  Fluoroscopy Time: 34 minutes 18 seconds (3429 mGy). COMPLICATIONS: Joel Mathis immediate. TECHNIQUE: Informed written consent was obtained from the patient after a thorough discussion of the procedural risks, benefits and alternatives. All questions were addressed. Maximal Sterile Barrier Technique was utilized including caps, mask, sterile gowns, sterile gloves, sterile drape, hand hygiene and  skin antiseptic. A timeout was performed prior to the initiation of the procedure. The right groin was prepped and draped in the usual sterile fashion. Thereafter using modified Seldinger technique, transfemoral access into the right common femoral artery was obtained without difficulty. Over a 0.035 inch guidewire, a 5 [place] Pinnacle sheath was inserted. Through this, and also over 0.035 inch guidewire, a 5 [place] JB 1 catheter was advanced to the aortic arch region and selectively positioned in the right common carotid artery, the right external carotid artery, the right vertebral artery, the left common carotid artery, left external carotid artery and the left vertebral artery. FINDINGS: The right vertebral artery origin is widely patent. The vessel is seen to opacify normally to the cranial skull base. Wide patency is seen of the right vertebrobasilar junction and the right posterior-inferior cerebellar artery. The basilar artery, the posterior cerebral arteries, the superior cerebellar arteries and the anterior-inferior cerebellar arteries opacify normally into the capillary and venous phases. The right common carotid arteriogram demonstrates the right external carotid artery and its major branches to be widely patent. Abnormal prominence is seen of the right lingual artery, and right facial artery distally. The remaining branches appear to be of normal caliber. There is no acute extravasation of contrast or of mass-effect noted. The  right internal carotid artery at the bulb to the cranial skull base opacifies normally. The petrous, cavernous and supraclinoid segments are widely patent. The right middle cerebral artery and the right anterior cerebral artery opacify normally into the capillary and venous phases. The left vertebral artery origin is widely patent. The vessel is seen to opacify normally to the cranial skull base. Wide patency is seen of the left vertebrobasilar junction and the left posterior  inferior cerebellar artery. The visualized portion of the proximal half of the basilar artery including the anterior-inferior cerebellar arteries is grossly patent into delayed arterial phase. The left common carotid arteriogram demonstrates the left internal carotid artery and its major branches to be widely patent. The petrous, cavernous and supraclinoid segments demonstrate wide patency. The left middle cerebral artery and left anterior cerebral artery opacify normally into the capillary and venous phases. A selective left external carotid arteriogram demonstrates abnormal prominence of the left lingual artery, the facial artery and the ascending pharyngeal artery. There is no acute extravasation of contrast noted on the images taken. ENDOVASCULAR SUPER SELECTIVE EMBOLIZATION OF THE LEFT AND RIGHT EXTERNAL CAROTID ARTERY BRANCHES NAMELY THE LEFT AND RIGHT LINGUAL ARTERIES, THE ASCENDING PHARYNGEAL ARTERY ON THE LEFT, AND THE MEDIAL PHARYNGEAL BRANCHES OF THE FACIAL ARTERY WITH PVA PARTICLES SIZES 300-500 MICRONS, AND 500-700 MICRONS. The diagnostic JB 1 catheter in the left common carotid artery was exchanged over a 0.035 inch 300 cm Rosen exchange guidewire for an 80 cm 6 [place] Cook shuttle sheath using biplane roadmap technique and constant fluoroscopic guidance. Good aspiration obtained from the hub of the [place] shuttle sheath. A gentle contrast injection demonstrated no evidence of spasms, dissections or of intraluminal filling defects. Over a 0.035 inch Roadrunner guidewire, using biplane roadmap technique and constant fluoroscopic guidance, the tip of the Cook shuttle sheath was then advanced into the proximal left external carotid artery just proximal to the origin of the superior thyroidal artery. Thereafter, using biplane roadmap technique and constant fluoroscopic guidance, in a coaxial manner and with constant heparinized saline infusion, a rapid transit 2 tip microcatheter was then advanced  over 0.014 inch Softip Synchro micro guidewire to selectively position in the left lingual artery, the left facial artery distally, and also the ascending pharyngeal artery. These were selected consecutively. They were advanced over a 0.014 inch Softip Synchro micro guidewire to the point where embolization was to begin. Prior to the embolization with the 300-500 micron particles, and 500-700 micro particles, control arteriograms were performed through the microcatheter to ensure safe position, and also to exclude dangerous anastomosis with the left vertebral artery or the left internal carotid artery. Also opacification of the left ophthalmic artery with the ciliary blush was also monitored. Embolization in all these three vessels was continued under intermittent fluoroscopy until there was stasis and/or reflux. The microcatheter position was then retrieved proximally until safe embolization had been undertaken. At the end of the embolization of the left external carotid artery branches as described above, a control arteriogram performed through the [facility] shuttle sheath just proximal to the origin of the left external carotid artery demonstrated angiographic stasis in the ascending pharyngeal artery distally, the lingual artery in its distal 2/3, and the pharygneal and lingual branches arising from the facial artery. A control arteriogram performed intracranially demonstrated no evidence of intraluminal filling defects or of occlusions. In a similar fashion, the right external carotid artery was accessed through the 6 [place] Arrow sheath positioned in the right common carotid artery  over an exchange guidewire. A MDP soft tip 6 [place] guide catheter was engaged at the origin of the right external carotid artery. Again with biplane roadmap technique and constant fluoroscopic guidance, in a coaxial manner and with constant heparinized saline infusion access was obtained with the rapid transit 2 tip microcatheter over a  0.014 inch Softip Synchro micro guidewire into the right lingual artery, and the right facial artery just proximal to the floor of the tongue branches. Again after having eliminated dangerous anastomosis, embolization was performed as described above with PVA particles 500-700 microns, and 300-500 microns until there was stasis and/or reflux. Control was obtained of the right external carotid origin to prevent reflux into the internal carotid artery. At the end of the embolization, a control arteriogram performed through the 6 [place] MDP Brite tip guide catheter again demonstrated complete stasis within the right lingual artery and also the facial branches. Intracranially no evidence of intraluminal filling defects or of occlusions were seen. Throughout the procedure, the patient's blood pressure and neurological status remained stable. The patient was given approximately 2000 units of Heparin IV. The 6 [place] Arrow neurovascular sheath was then retrieved with the 6 [place] Brite tip MDP Envoy guide catheter into the abdominal aorta and exchanged over a J-tip guidewire for a 6 [place] Pinnacle sheath. This in turn was then removed with the application of an external closure device. The right groin appeared soft without evidence of hematoma. Distal pulses remained 2+ in the dorsalis pedis and the posterior tibial arteries. At the end of the procedure, the patient was then transported to the neuro ICU to undergo further evaluation. IMPRESSION: Status post endovascular super selective embolization of external carotid artery branches, the left lingual artery, the right lingual artery, the floor of the mouth branches arising from the facial arteries bilaterally, and the left ascending pharyngeal artery with PVA particles sizes 300-500 microns, and 500-700 microns. PLAN: The patient was transferred to ICU for medical and hemodynamic stabilization. Electronically Signed   By: Luanne Bras M.D.   On: 05/07/2017 21:25    Ir Angio External Carotid Sel Ext Carotid Bilat Mod Sed  Result Date: 05/08/2017 CLINICAL DATA:  Recurrent copious oropharyngeal hemorrhage. Patient with lingual carcinoma. EXAM: BILATERAL COMMON CAROTID AND INNOMINATE ANGIOGRAPHY; IR NEURO EACH ADD'L AFTER BASIC UNI LEFT (MS); TRANSCATHETER THERAPY EMBOLIZATION; IR ANGIO EXTERNAL CAROTID SEL EXT CAROTID BILAT MOD SED; IR NEURO EACH ADD'L AFTER BASIC UNI RIGHT (MS); ARTERIOGRAPHY COMPARISON:  Diagnostic catheter arteriogram of 05/03/2017 earlier in the day. MEDICATIONS: Heparin 3,000 units IV; Ancef 2 g IV antibiotic was administered within 1 hour of the procedure. ANESTHESIA/SEDATION: General anesthesia. CONTRAST:  Isovue 300 250 cc. FLUOROSCOPY TIME:  Fluoroscopy Time: 51 minutes 18 seconds (3795 mGy). COMPLICATIONS: Joel Mathis immediate. TECHNIQUE: Informed written consent was obtained from the patient's mother after a thorough discussion of the procedural risks, benefits and alternatives. All questions were addressed. Maximal Sterile Barrier Technique was utilized including caps, mask, sterile gowns, sterile gloves, sterile drape, hand hygiene and skin antiseptic. A timeout was performed prior to the initiation of the procedure. The right groin was prepped and draped in the usual sterile fashion. Thereafter using modified Seldinger technique, transfemoral access into the right common femoral artery was obtained without difficulty. Over a 0.035 inch guidewire, a 5 French Pinnacle sheath was inserted. Through this, and also over 0.035 inch guidewire, a 5 [place] JB 1 catheter was advanced to the aortic arch region and selectively positioned in the right common carotid artery, the  right external carotid artery, , the left common carotid artery and the left external carotid artery. FINDINGS: TREATMENT The right common carotid arteriogram again demonstrates the right internal carotid artery at the bulb to the cranial skull base to opacify normally. The petrous,  cavernous and supraclinoid segments demonstrate wide patency. The right middle cerebral artery and the right anterior cerebral artery opacify normally into the capillary and venous phases. The right external carotid arteriogram demonstrated prominence of the lingual artery, the facial artery, the superior thyroidal artery, and the internal maxillary artery at the level of the right transverse facial artery. The diagnostic JB 1 catheter was then exchanged over a 0.035 inch 300 cm Rosen exchange guidewire for a 6 French 65 cm Arrow neurovascular sheath using biplane roadmap technique and constant fluoroscopic guidance. Good aspiration obtained from the hub of the neurovascular Arrow sheath. Gentle contrast injection demonstrated no evidence of spasms, dissections or of intraluminal filling defects. This was then connected to continuous heparinized saline infusion. Over a [employer] guidewire, a 6 [place] MDP 90 cm Envoy guide catheter was then advanced and positioned just proximal to the right common carotid bifurcation. The guidewire was removed. Good aspiration obtained from the hub of the MDP guide catheter. A gentle contrast injection demonstrated no evidence of spasms, dissections or of intraluminal filling defects. Over a 0.035 inch Roadrunner guidewire, the tip of the MDP guide catheter was then advanced and positioned in the right external carotid artery just distal to the origin of the lingual artery. The guidewire was removed. Good aspiration obtained from the hub of the 6 [place] MDP guide catheter. A control arteriogram was then performed with roadmap technique. Thereafter, a rapid transit 2 tip microcatheter was then advanced over a 0.014 inch Softip Synchro micro guidewire to the distal end of the microcatheter. The guidewire was removed. Good aspiration obtained from hub of the microcatheter. A gentle control arteriogram performed through the microcatheter demonstrated no dangerous abnormal  communication between the internal carotid on the right side and and the right vertebral artery. Thereafter, the tip of the microcatheter in the internal maxillary artery on the right just proximal to the origin of the transverse facial artery, progressive embolization was then performed using PVA particles of sizes 250-300 microns, 350-500 microns, and 500-700 microns. These were mixed in with 50% contrast and 50% heparinized saline infusion. Control embolization was performed with biplane intermittent fluoroscopy until there was stasis and/or reflux the tip of the microcatheter. Subsequent embolizations super selectively were then performed again using the rapid transit microcatheter with super selective cannulations of the right lingual artery, the right facial artery just proximal to the origin of the anterior mandibular branches, and also the superior thyroidal artery branch leading to the upper pharynx. All of these vessels were then embolized again using particle sizes of 250-300 microns, 350-500 microns, and 500-700 microns mixed with 50% contrast and 50% heparinized saline infusion. Prior to the embolizations, dangerous anastomosis were excluded by controlled gentle hand held arteriograms. Embolizations were performed until there was reflux or stasis within the selected arterial feeder. Special attention was given to ensure no retrograde reflux into the internal carotid artery. Following the final vessel embolization, the microcatheter was retrieved and removed. The 6 [place] MDP Brite tip Envoy guide catheter was retrieved into the right common carotid artery. A control arteriogram performed centered extra cranially and intracranially demonstrated no intraluminal filling defects or occlusions intracranially. There continued to be complete stasis of the vessels super selectively embolized. The  combination of the 6 [place] neurovascular Arrow sheath, and the 6 French 90 cm MDP Brite tip Envoy guide catheter  was then positioned as described above in the left external carotid artery. The guidewire was removed. Roadmap arteriogram was then performed in the biplane mode. Thereafter super selective cannulations and embolizations were then performed using a rapid transit 2 tip microcatheter over a 0.014 inch Softip Synchro micro guidewire into the left internal maxillary artery at the level of the left transverse facial artery, the left external carotid artery proximal to the origin of the internal maxillary artery, the left lingual artery, the left facial artery just proximal to the level of the origin of the mandibular branches. Each of these arterial feeders were super selectively cannulated followed by verified safe positioning of the tip of the microcatheter prior to the embolization with the 250-300 microns, 350-500 microns and 500-700 microns PVA particles mixed with 50% contrast and 50% heparinized saline infusion. Again embolizations were performed until there was complete stasis or reflux to the tip of the microcatheter. Dangerous communications were eliminated with selective arteriograms in each of these vessels prior to the embolizations. At the end of the final embolization, microcatheters were retrieved and removed. Copious back bleed and aspiration was carried out above the 6 [place] MDP Brite tip Envoy guide catheter. This was then positioned in the left common carotid artery. Control arteriogram performed centered intracranially and extra cranially demonstrated no intraluminal filling defects or occlusions in the intracranial compartment. There was complete angiographic stasis of contrast in the embolized left external carotid artery branches. The microcatheter was then retrieved and removed. The 6 [place] MDP Brite tip Envoy guide catheter was retrieved with the neurovascular sheath into the abdominal aorta and exchanged over a J-tip guidewire for 6 a [place] Pinnacle sheath. This in turn was then successfully  replaced with the application of an external closure device successfully. The right groin appeared soft without evidence of a hematoma or bleeding. The distal pulses remained 2+ in the dorsalis pedis and in the posterior tibial regions bilaterally unchanged from prior to the procedure. The patient was then transported to the ICU for further medical management. At the end of the procedure the oropharynx appeared dry without evidence of bleeding. Also no bleeding was noted from the patient's nares. IMPRESSION: Status post super selective embolization of left and right external carotid artery branches as described above using PVA particles of sizes 250-300 microns, 350-500 microns and 500-700 microns with complete stasis of the super selectively embolized vessels bilaterally as described above. PLAN: Patient transferred to the medical ICU for further medical management. INDICATION: Recurrent copious oropharyngeal hemorrhage. Patient with lingual resection for lingual carcinoma. Electronically Signed   By: Luanne Bras M.D.   On: 05/07/2017 23:07   Ir Neuro Each Add'l After Basic Uni Left (ms)  Result Date: 05/08/2017 CLINICAL DATA:  Extensive oropharyngeal uncontrolled hemorrhage. Patient with history of left sided lingual squamous cell carcinoma. EXAM: BILATERAL COMMON CAROTID AND INNOMINATE ANGIOGRAPHY; IR ANGIO EXTERNAL CAROTID SEL EXT CAROTID BILAT MOD SED; IR NEURO EACH ADD'L AFTER BASIC UNI LEFT (MS); IR ANGIO VERTEBRAL SEL VERTEBRAL BILAT MOD SED; IR NEURO EACH ADD'L AFTER BASIC UNI RIGHT (MS); ARTERIOGRAPHY; TRANSCATHETER THERAPY EMBOLIZATION MEDICATIONS: Heparin 1000 units IV; Ancef 2 g IV antibiotic was administered within 1 hour of the procedure. ANESTHESIA/SEDATION: General anesthesia. CONTRAST:  Isovue 300 approximately 150 mL. FLUOROSCOPY TIME:  Fluoroscopy Time: 34 minutes 18 seconds (3429 mGy). COMPLICATIONS: Joel Mathis immediate. TECHNIQUE: Informed written consent was obtained  from the patient  after a thorough discussion of the procedural risks, benefits and alternatives. All questions were addressed. Maximal Sterile Barrier Technique was utilized including caps, mask, sterile gowns, sterile gloves, sterile drape, hand hygiene and skin antiseptic. A timeout was performed prior to the initiation of the procedure. The right groin was prepped and draped in the usual sterile fashion. Thereafter using modified Seldinger technique, transfemoral access into the right common femoral artery was obtained without difficulty. Over a 0.035 inch guidewire, a 5 [place] Pinnacle sheath was inserted. Through this, and also over 0.035 inch guidewire, a 5 [place] JB 1 catheter was advanced to the aortic arch region and selectively positioned in the right common carotid artery, the right external carotid artery, the right vertebral artery, the left common carotid artery, left external carotid artery and the left vertebral artery. FINDINGS: The right vertebral artery origin is widely patent. The vessel is seen to opacify normally to the cranial skull base. Wide patency is seen of the right vertebrobasilar junction and the right posterior-inferior cerebellar artery. The basilar artery, the posterior cerebral arteries, the superior cerebellar arteries and the anterior-inferior cerebellar arteries opacify normally into the capillary and venous phases. The right common carotid arteriogram demonstrates the right external carotid artery and its major branches to be widely patent. Abnormal prominence is seen of the right lingual artery, and right facial artery distally. The remaining branches appear to be of normal caliber. There is no acute extravasation of contrast or of mass-effect noted. The right internal carotid artery at the bulb to the cranial skull base opacifies normally. The petrous, cavernous and supraclinoid segments are widely patent. The right middle cerebral artery and the right anterior cerebral artery opacify  normally into the capillary and venous phases. The left vertebral artery origin is widely patent. The vessel is seen to opacify normally to the cranial skull base. Wide patency is seen of the left vertebrobasilar junction and the left posterior inferior cerebellar artery. The visualized portion of the proximal half of the basilar artery including the anterior-inferior cerebellar arteries is grossly patent into delayed arterial phase. The left common carotid arteriogram demonstrates the left internal carotid artery and its major branches to be widely patent. The petrous, cavernous and supraclinoid segments demonstrate wide patency. The left middle cerebral artery and left anterior cerebral artery opacify normally into the capillary and venous phases. A selective left external carotid arteriogram demonstrates abnormal prominence of the left lingual artery, the facial artery and the ascending pharyngeal artery. There is no acute extravasation of contrast noted on the images taken. ENDOVASCULAR SUPER SELECTIVE EMBOLIZATION OF THE LEFT AND RIGHT EXTERNAL CAROTID ARTERY BRANCHES NAMELY THE LEFT AND RIGHT LINGUAL ARTERIES, THE ASCENDING PHARYNGEAL ARTERY ON THE LEFT, AND THE MEDIAL PHARYNGEAL BRANCHES OF THE FACIAL ARTERY WITH PVA PARTICLES SIZES 300-500 MICRONS, AND 500-700 MICRONS. The diagnostic JB 1 catheter in the left common carotid artery was exchanged over a 0.035 inch 300 cm Rosen exchange guidewire for an 80 cm 6 [place] Cook shuttle sheath using biplane roadmap technique and constant fluoroscopic guidance. Good aspiration obtained from the hub of the [place] shuttle sheath. A gentle contrast injection demonstrated no evidence of spasms, dissections or of intraluminal filling defects. Over a 0.035 inch Roadrunner guidewire, using biplane roadmap technique and constant fluoroscopic guidance, the tip of the [place] shuttle sheath was then advanced into the proximal left external carotid artery just proximal to  the origin of the superior thyroidal artery. Thereafter, using biplane roadmap technique and  constant fluoroscopic guidance, in a coaxial manner and with constant heparinized saline infusion, a rapid transit 2 tip microcatheter was then advanced over 0.014 inch Softip Synchro micro guidewire to selectively position in the left lingual artery, the left facial artery distally, and also the ascending pharyngeal artery. These were selected consecutively. They were advanced over a 0.014 inch Softip Synchro micro guidewire to the point where embolization was to begin. Prior to the embolization with the 300-500 micron particles, and 500-700 micro particles, control arteriograms were performed through the microcatheter to ensure safe position, and also to exclude dangerous anastomosis with the left vertebral artery or the left internal carotid artery. Also opacification of the left ophthalmic artery with the ciliary blush was also monitored. Embolization in all these three vessels was continued under intermittent fluoroscopy until there was stasis and/or reflux. The microcatheter position was then retrieved proximally until safe embolization had been undertaken. At the end of the embolization of the left external carotid artery branches as described above, a control arteriogram performed through the [facility] shuttle sheath just proximal to the origin of the left external carotid artery demonstrated angiographic stasis in the ascending pharyngeal artery distally, the lingual artery in its distal 2/3, and the pharygneal and lingual branches arising from the facial artery. A control arteriogram performed intracranially demonstrated no evidence of intraluminal filling defects or of occlusions. In a similar fashion, the right external carotid artery was accessed through the 6 [place] Arrow sheath positioned in the right common carotid artery over an exchange guidewire. A MDP soft tip 6 [place] guide catheter was engaged at the origin  of the right external carotid artery. Again with biplane roadmap technique and constant fluoroscopic guidance, in a coaxial manner and with constant heparinized saline infusion access was obtained with the rapid transit 2 tip microcatheter over a 0.014 inch Softip Synchro micro guidewire into the right lingual artery, and the right facial artery just proximal to the floor of the tongue branches. Again after having eliminated dangerous anastomosis, embolization was performed as described above with PVA particles 500-700 microns, and 300-500 microns until there was stasis and/or reflux. Control was obtained of the right external carotid origin to prevent reflux into the internal carotid artery. At the end of the embolization, a control arteriogram performed through the 6 [place] MDP Brite tip guide catheter again demonstrated complete stasis within the right lingual artery and also the facial branches. Intracranially no evidence of intraluminal filling defects or of occlusions were seen. Throughout the procedure, the patient's blood pressure and neurological status remained stable. The patient was given approximately 2000 units of Heparin IV. The 6 [place] Arrow neurovascular sheath was then retrieved with the 6 [place] Brite tip MDP Envoy guide catheter into the abdominal aorta and exchanged over a J-tip guidewire for a 6 [place] Pinnacle sheath. This in turn was then removed with the application of an external closure device. The right groin appeared soft without evidence of hematoma. Distal pulses remained 2+ in the dorsalis pedis and the posterior tibial arteries. At the end of the procedure, the patient was then transported to the neuro ICU to undergo further evaluation. IMPRESSION: Status post endovascular super selective embolization of external carotid artery branches, the left lingual artery, the right lingual artery, the floor of the mouth branches arising from the facial arteries bilaterally, and the left  ascending pharyngeal artery with PVA particles sizes 300-500 microns, and 500-700 microns. PLAN: The patient was transferred to ICU for medical and hemodynamic stabilization. Electronically Signed  By: Luanne Bras M.D.   On: 05/07/2017 21:25   Ir Neuro Each Add'l After Basic Uni Left (ms)  Result Date: 05/08/2017 CLINICAL DATA:  Recurrent copious oropharyngeal hemorrhage. Patient with lingual carcinoma. EXAM: BILATERAL COMMON CAROTID AND INNOMINATE ANGIOGRAPHY; IR NEURO EACH ADD'L AFTER BASIC UNI LEFT (MS); TRANSCATHETER THERAPY EMBOLIZATION; IR ANGIO EXTERNAL CAROTID SEL EXT CAROTID BILAT MOD SED; IR NEURO EACH ADD'L AFTER BASIC UNI RIGHT (MS); ARTERIOGRAPHY COMPARISON:  Diagnostic catheter arteriogram of 05/03/2017 earlier in the day. MEDICATIONS: Heparin 3,000 units IV; Ancef 2 g IV antibiotic was administered within 1 hour of the procedure. ANESTHESIA/SEDATION: General anesthesia. CONTRAST:  Isovue 300 250 cc. FLUOROSCOPY TIME:  Fluoroscopy Time: 51 minutes 18 seconds (3795 mGy). COMPLICATIONS: Joel Mathis immediate. TECHNIQUE: Informed written consent was obtained from the patient's mother after a thorough discussion of the procedural risks, benefits and alternatives. All questions were addressed. Maximal Sterile Barrier Technique was utilized including caps, mask, sterile gowns, sterile gloves, sterile drape, hand hygiene and skin antiseptic. A timeout was performed prior to the initiation of the procedure. The right groin was prepped and draped in the usual sterile fashion. Thereafter using modified Seldinger technique, transfemoral access into the right common femoral artery was obtained without difficulty. Over a 0.035 inch guidewire, a 5 [place] Pinnacle sheath was inserted. Through this, and also over 0.035 inch guidewire, a 5 [place] JB 1 catheter was advanced to the aortic arch region and selectively positioned in the right common carotid artery, the right external carotid artery, , the left common  carotid artery and the left external carotid artery. FINDINGS: TREATMENT The right common carotid arteriogram again demonstrates the right internal carotid artery at the bulb to the cranial skull base to opacify normally. The petrous, cavernous and supraclinoid segments demonstrate wide patency. The right middle cerebral artery and the right anterior cerebral artery opacify normally into the capillary and venous phases. The right external carotid arteriogram demonstrated prominence of the lingual artery, the facial artery, the superior thyroidal artery, and the internal maxillary artery at the level of the right transverse facial artery. The diagnostic JB 1 catheter was then exchanged over a 0.035 inch 300 cm Rosen exchange guidewire for a 6 [place] 65 cm Arrow neurovascular sheath using biplane roadmap technique and constant fluoroscopic guidance. Good aspiration obtained from the hub of the neurovascular Arrow sheath. Gentle contrast injection demonstrated no evidence of spasms, dissections or of intraluminal filling defects. This was then connected to continuous heparinized saline infusion. Over a [employer] guidewire, a 6 [place] MDP 90 cm Envoy guide catheter was then advanced and positioned just proximal to the right common carotid bifurcation. The guidewire was removed. Good aspiration obtained from the hub of the MDP guide catheter. A gentle contrast injection demonstrated no evidence of spasms, dissections or of intraluminal filling defects. Over a 0.035 inch Roadrunner guidewire, the tip of the MDP guide catheter was then advanced and positioned in the right external carotid artery just distal to the origin of the lingual artery. The guidewire was removed. Good aspiration obtained from the hub of the 6 [place] MDP guide catheter. A control arteriogram was then performed with roadmap technique. Thereafter, a rapid transit 2 tip microcatheter was then advanced over a 0.014 inch Softip Synchro micro  guidewire to the distal end of the microcatheter. The guidewire was removed. Good aspiration obtained from hub of the microcatheter. A gentle control arteriogram performed through the microcatheter demonstrated no dangerous abnormal communication between the internal carotid on the right  side and and the right vertebral artery. Thereafter, the tip of the microcatheter in the internal maxillary artery on the right just proximal to the origin of the transverse facial artery, progressive embolization was then performed using PVA particles of sizes 250-300 microns, 350-500 microns, and 500-700 microns. These were mixed in with 50% contrast and 50% heparinized saline infusion. Control embolization was performed with biplane intermittent fluoroscopy until there was stasis and/or reflux the tip of the microcatheter. Subsequent embolizations super selectively were then performed again using the rapid transit microcatheter with super selective cannulations of the right lingual artery, the right facial artery just proximal to the origin of the anterior mandibular branches, and also the superior thyroidal artery branch leading to the upper pharynx. All of these vessels were then embolized again using particle sizes of 250-300 microns, 350-500 microns, and 500-700 microns mixed with 50% contrast and 50% heparinized saline infusion. Prior to the embolizations, dangerous anastomosis were excluded by controlled gentle hand held arteriograms. Embolizations were performed until there was reflux or stasis within the selected arterial feeder. Special attention was given to ensure no retrograde reflux into the internal carotid artery. Following the final vessel embolization, the microcatheter was retrieved and removed. The 6 [place] MDP Brite tip Envoy guide catheter was retrieved into the right common carotid artery. A control arteriogram performed centered extra cranially and intracranially demonstrated no intraluminal filling defects  or occlusions intracranially. There continued to be complete stasis of the vessels super selectively embolized. The combination of the 6 [place] neurovascular Arrow sheath, and the 6 [place] 90 cm MDP Brite tip Envoy guide catheter was then positioned as described above in the left external carotid artery. The guidewire was removed. Roadmap arteriogram was then performed in the biplane mode. Thereafter super selective cannulations and embolizations were then performed using a rapid transit 2 tip microcatheter over a 0.014 inch Softip Synchro micro guidewire into the left internal maxillary artery at the level of the left transverse facial artery, the left external carotid artery proximal to the origin of the internal maxillary artery, the left lingual artery, the left facial artery just proximal to the level of the origin of the mandibular branches. Each of these arterial feeders were super selectively cannulated followed by verified safe positioning of the tip of the microcatheter prior to the embolization with the 250-300 microns, 350-500 microns and 500-700 microns PVA particles mixed with 50% contrast and 50% heparinized saline infusion. Again embolizations were performed until there was complete stasis or reflux to the tip of the microcatheter. Dangerous communications were eliminated with selective arteriograms in each of these vessels prior to the embolizations. At the end of the final embolization, microcatheters were retrieved and removed. Copious back bleed and aspiration was carried out above the 6 [place] MDP Brite tip Envoy guide catheter. This was then positioned in the left common carotid artery. Control arteriogram performed centered intracranially and extra cranially demonstrated no intraluminal filling defects or occlusions in the intracranial compartment. There was complete angiographic stasis of contrast in the embolized left external carotid artery branches. The microcatheter was then retrieved and  removed. The 6 [place] MDP Brite tip Envoy guide catheter was retrieved with the neurovascular sheath into the abdominal aorta and exchanged over a J-tip guidewire for 6 a [place] Pinnacle sheath. This in turn was then successfully replaced with the application of an external closure device successfully. The right groin appeared soft without evidence of a hematoma or bleeding. The distal pulses remained 2+ in the dorsalis  pedis and in the posterior tibial regions bilaterally unchanged from prior to the procedure. The patient was then transported to the ICU for further medical management. At the end of the procedure the oropharynx appeared dry without evidence of bleeding. Also no bleeding was noted from the patient's nares. IMPRESSION: Status post super selective embolization of left and right external carotid artery branches as described above using PVA particles of sizes 250-300 microns, 350-500 microns and 500-700 microns with complete stasis of the super selectively embolized vessels bilaterally as described above. PLAN: Patient transferred to the medical ICU for further medical management. INDICATION: Recurrent copious oropharyngeal hemorrhage. Patient with lingual resection for lingual carcinoma. Electronically Signed   By: Luanne Bras M.D.   On: 05/07/2017 23:07   Ir Neuro Each Add'l After Basic Uni Right (ms)  Result Date: 05/08/2017 CLINICAL DATA:  Extensive oropharyngeal uncontrolled hemorrhage. Patient with history of left sided lingual squamous cell carcinoma. EXAM: BILATERAL COMMON CAROTID AND INNOMINATE ANGIOGRAPHY; IR ANGIO EXTERNAL CAROTID SEL EXT CAROTID BILAT MOD SED; IR NEURO EACH ADD'L AFTER BASIC UNI LEFT (MS); IR ANGIO VERTEBRAL SEL VERTEBRAL BILAT MOD SED; IR NEURO EACH ADD'L AFTER BASIC UNI RIGHT (MS); ARTERIOGRAPHY; TRANSCATHETER THERAPY EMBOLIZATION MEDICATIONS: Heparin 1000 units IV; Ancef 2 g IV antibiotic was administered within 1 hour of the procedure. ANESTHESIA/SEDATION:  General anesthesia. CONTRAST:  Isovue 300 approximately 150 mL. FLUOROSCOPY TIME:  Fluoroscopy Time: 34 minutes 18 seconds (3429 mGy). COMPLICATIONS: Joel Mathis immediate. TECHNIQUE: Informed written consent was obtained from the patient after a thorough discussion of the procedural risks, benefits and alternatives. All questions were addressed. Maximal Sterile Barrier Technique was utilized including caps, mask, sterile gowns, sterile gloves, sterile drape, hand hygiene and skin antiseptic. A timeout was performed prior to the initiation of the procedure. The right groin was prepped and draped in the usual sterile fashion. Thereafter using modified Seldinger technique, transfemoral access into the right common femoral artery was obtained without difficulty. Over a 0.035 inch guidewire, a 5 [place] Pinnacle sheath was inserted. Through this, and also over 0.035 inch guidewire, a 5 [place] JB 1 catheter was advanced to the aortic arch region and selectively positioned in the right common carotid artery, the right external carotid artery, the right vertebral artery, the left common carotid artery, left external carotid artery and the left vertebral artery. FINDINGS: The right vertebral artery origin is widely patent. The vessel is seen to opacify normally to the cranial skull base. Wide patency is seen of the right vertebrobasilar junction and the right posterior-inferior cerebellar artery. The basilar artery, the posterior cerebral arteries, the superior cerebellar arteries and the anterior-inferior cerebellar arteries opacify normally into the capillary and venous phases. The right common carotid arteriogram demonstrates the right external carotid artery and its major branches to be widely patent. Abnormal prominence is seen of the right lingual artery, and right facial artery distally. The remaining branches appear to be of normal caliber. There is no acute extravasation of contrast or of mass-effect noted. The right  internal carotid artery at the bulb to the cranial skull base opacifies normally. The petrous, cavernous and supraclinoid segments are widely patent. The right middle cerebral artery and the right anterior cerebral artery opacify normally into the capillary and venous phases. The left vertebral artery origin is widely patent. The vessel is seen to opacify normally to the cranial skull base. Wide patency is seen of the left vertebrobasilar junction and the left posterior inferior cerebellar artery. The visualized portion of the proximal half of  the basilar artery including the anterior-inferior cerebellar arteries is grossly patent into delayed arterial phase. The left common carotid arteriogram demonstrates the left internal carotid artery and its major branches to be widely patent. The petrous, cavernous and supraclinoid segments demonstrate wide patency. The left middle cerebral artery and left anterior cerebral artery opacify normally into the capillary and venous phases. A selective left external carotid arteriogram demonstrates abnormal prominence of the left lingual artery, the facial artery and the ascending pharyngeal artery. There is no acute extravasation of contrast noted on the images taken. ENDOVASCULAR SUPER SELECTIVE EMBOLIZATION OF THE LEFT AND RIGHT EXTERNAL CAROTID ARTERY BRANCHES NAMELY THE LEFT AND RIGHT LINGUAL ARTERIES, THE ASCENDING PHARYNGEAL ARTERY ON THE LEFT, AND THE MEDIAL PHARYNGEAL BRANCHES OF THE FACIAL ARTERY WITH PVA PARTICLES SIZES 300-500 MICRONS, AND 500-700 MICRONS. The diagnostic JB 1 catheter in the left common carotid artery was exchanged over a 0.035 inch 300 cm Rosen exchange guidewire for an 80 cm 6 [place] shuttle sheath using biplane roadmap technique and constant fluoroscopic guidance. Good aspiration obtained from the hub of the [place] shuttle sheath. A gentle contrast injection demonstrated no evidence of spasms, dissections or of intraluminal filling  defects. Over a 0.035 inch Roadrunner guidewire, using biplane roadmap technique and constant fluoroscopic guidance, the tip of the [place] shuttle sheath was then advanced into the proximal left external carotid artery just proximal to the origin of the superior thyroidal artery. Thereafter, using biplane roadmap technique and constant fluoroscopic guidance, in a coaxial manner and with constant heparinized saline infusion, a rapid transit 2 tip microcatheter was then advanced over 0.014 inch Softip Synchro micro guidewire to selectively position in the left lingual artery, the left facial artery distally, and also the ascending pharyngeal artery. These were selected consecutively. They were advanced over a 0.014 inch Softip Synchro micro guidewire to the point where embolization was to begin. Prior to the embolization with the 300-500 micron particles, and 500-700 micro particles, control arteriograms were performed through the microcatheter to ensure safe position, and also to exclude dangerous anastomosis with the left vertebral artery or the left internal carotid artery. Also opacification of the left ophthalmic artery with the ciliary blush was also monitored. Embolization in all these three vessels was continued under intermittent fluoroscopy until there was stasis and/or reflux. The microcatheter position was then retrieved proximally until safe embolization had been undertaken. At the end of the embolization of the left external carotid artery branches as described above, a control arteriogram performed through the [facility] shuttle sheath just proximal to the origin of the left external carotid artery demonstrated angiographic stasis in the ascending pharyngeal artery distally, the lingual artery in its distal 2/3, and the pharygneal and lingual branches arising from the facial artery. A control arteriogram performed intracranially demonstrated no evidence of intraluminal filling defects or of occlusions. In a  similar fashion, the right external carotid artery was accessed through the 6 [place] Arrow sheath positioned in the right common carotid artery over an exchange guidewire. A MDP soft tip 6 [place] guide catheter was engaged at the origin of the right external carotid artery. Again with biplane roadmap technique and constant fluoroscopic guidance, in a coaxial manner and with constant heparinized saline infusion access was obtained with the rapid transit 2 tip microcatheter over a 0.014 inch Softip Synchro micro guidewire into the right lingual artery, and the right facial artery just proximal to the floor of the tongue branches. Again after having eliminated dangerous anastomosis, embolization  was performed as described above with PVA particles 500-700 microns, and 300-500 microns until there was stasis and/or reflux. Control was obtained of the right external carotid origin to prevent reflux into the internal carotid artery. At the end of the embolization, a control arteriogram performed through the 6 [place] MDP Brite tip guide catheter again demonstrated complete stasis within the right lingual artery and also the facial branches. Intracranially no evidence of intraluminal filling defects or of occlusions were seen. Throughout the procedure, the patient's blood pressure and neurological status remained stable. The patient was given approximately 2000 units of Heparin IV. The 6 [place] Arrow neurovascular sheath was then retrieved with the 6 [place] Brite tip MDP Envoy guide catheter into the abdominal aorta and exchanged over a J-tip guidewire for a 6 [place] Pinnacle sheath. This in turn was then removed with the application of an external closure device. The right groin appeared soft without evidence of hematoma. Distal pulses remained 2+ in the dorsalis pedis and the posterior tibial arteries. At the end of the procedure, the patient was then transported to the neuro ICU to undergo further evaluation. IMPRESSION:  Status post endovascular super selective embolization of external carotid artery branches, the left lingual artery, the right lingual artery, the floor of the mouth branches arising from the facial arteries bilaterally, and the left ascending pharyngeal artery with PVA particles sizes 300-500 microns, and 500-700 microns. PLAN: The patient was transferred to ICU for medical and hemodynamic stabilization. Electronically Signed   By: Luanne Bras M.D.   On: 05/07/2017 21:25   Ir Neuro Each Add'l After Basic Uni Right (ms)  Result Date: 05/08/2017 CLINICAL DATA:  Recurrent copious oropharyngeal hemorrhage. Patient with lingual carcinoma. EXAM: BILATERAL COMMON CAROTID AND INNOMINATE ANGIOGRAPHY; IR NEURO EACH ADD'L AFTER BASIC UNI LEFT (MS); TRANSCATHETER THERAPY EMBOLIZATION; IR ANGIO EXTERNAL CAROTID SEL EXT CAROTID BILAT MOD SED; IR NEURO EACH ADD'L AFTER BASIC UNI RIGHT (MS); ARTERIOGRAPHY COMPARISON:  Diagnostic catheter arteriogram of 05/03/2017 earlier in the day. MEDICATIONS: Heparin 3,000 units IV; Ancef 2 g IV antibiotic was administered within 1 hour of the procedure. ANESTHESIA/SEDATION: General anesthesia. CONTRAST:  Isovue 300 250 cc. FLUOROSCOPY TIME:  Fluoroscopy Time: 51 minutes 18 seconds (3795 mGy). COMPLICATIONS: Joel Mathis immediate. TECHNIQUE: Informed written consent was obtained from the patient's mother after a thorough discussion of the procedural risks, benefits and alternatives. All questions were addressed. Maximal Sterile Barrier Technique was utilized including caps, mask, sterile gowns, sterile gloves, sterile drape, hand hygiene and skin antiseptic. A timeout was performed prior to the initiation of the procedure. The right groin was prepped and draped in the usual sterile fashion. Thereafter using modified Seldinger technique, transfemoral access into the right common femoral artery was obtained without difficulty. Over a 0.035 inch guidewire, a 5 [place] Pinnacle sheath was  inserted. Through this, and also over 0.035 inch guidewire, a 5 [place] JB 1 catheter was advanced to the aortic arch region and selectively positioned in the right common carotid artery, the right external carotid artery, , the left common carotid artery and the left external carotid artery. FINDINGS: TREATMENT The right common carotid arteriogram again demonstrates the right internal carotid artery at the bulb to the cranial skull base to opacify normally. The petrous, cavernous and supraclinoid segments demonstrate wide patency. The right middle cerebral artery and the right anterior cerebral artery opacify normally into the capillary and venous phases. The right external carotid arteriogram demonstrated prominence of the lingual artery, the facial artery, the superior thyroidal artery,  and the internal maxillary artery at the level of the right transverse facial artery. The diagnostic JB 1 catheter was then exchanged over a 0.035 inch 300 cm Rosen exchange guidewire for a 6 French 65 cm Arrow neurovascular sheath using biplane roadmap technique and constant fluoroscopic guidance. Good aspiration obtained from the hub of the neurovascular Arrow sheath. Gentle contrast injection demonstrated no evidence of spasms, dissections or of intraluminal filling defects. This was then connected to continuous heparinized saline infusion. Over a [employer] guidewire, a 6 [place] MDP 90 cm Envoy guide catheter was then advanced and positioned just proximal to the right common carotid bifurcation. The guidewire was removed. Good aspiration obtained from the hub of the MDP guide catheter. A gentle contrast injection demonstrated no evidence of spasms, dissections or of intraluminal filling defects. Over a 0.035 inch Roadrunner guidewire, the tip of the MDP guide catheter was then advanced and positioned in the right external carotid artery just distal to the origin of the lingual artery. The guidewire was removed. Good  aspiration obtained from the hub of the 6 [place] MDP guide catheter. A control arteriogram was then performed with roadmap technique. Thereafter, a rapid transit 2 tip microcatheter was then advanced over a 0.014 inch Softip Synchro micro guidewire to the distal end of the microcatheter. The guidewire was removed. Good aspiration obtained from hub of the microcatheter. A gentle control arteriogram performed through the microcatheter demonstrated no dangerous abnormal communication between the internal carotid on the right side and and the right vertebral artery. Thereafter, the tip of the microcatheter in the internal maxillary artery on the right just proximal to the origin of the transverse facial artery, progressive embolization was then performed using PVA particles of sizes 250-300 microns, 350-500 microns, and 500-700 microns. These were mixed in with 50% contrast and 50% heparinized saline infusion. Control embolization was performed with biplane intermittent fluoroscopy until there was stasis and/or reflux the tip of the microcatheter. Subsequent embolizations super selectively were then performed again using the rapid transit microcatheter with super selective cannulations of the right lingual artery, the right facial artery just proximal to the origin of the anterior mandibular branches, and also the superior thyroidal artery branch leading to the upper pharynx. All of these vessels were then embolized again using particle sizes of 250-300 microns, 350-500 microns, and 500-700 microns mixed with 50% contrast and 50% heparinized saline infusion. Prior to the embolizations, dangerous anastomosis were excluded by controlled gentle hand held arteriograms. Embolizations were performed until there was reflux or stasis within the selected arterial feeder. Special attention was given to ensure no retrograde reflux into the internal carotid artery. Following the final vessel embolization, the microcatheter  was retrieved and removed. The 6 [place] MDP Brite tip Envoy guide catheter was retrieved into the right common carotid artery. A control arteriogram performed centered extra cranially and intracranially demonstrated no intraluminal filling defects or occlusions intracranially. There continued to be complete stasis of the vessels super selectively embolized. The combination of the 6 [place] neurovascular Arrow sheath, and the 6 French 90 cm MDP Brite tip Envoy guide catheter was then positioned as described above in the left external carotid artery. The guidewire was removed. Roadmap arteriogram was then performed in the biplane mode. Thereafter super selective cannulations and embolizations were then performed using a rapid transit 2 tip microcatheter over a 0.014 inch Softip Synchro micro guidewire into the left internal maxillary artery at the level of the left transverse facial artery, the left external  carotid artery proximal to the origin of the internal maxillary artery, the left lingual artery, the left facial artery just proximal to the level of the origin of the mandibular branches. Each of these arterial feeders were super selectively cannulated followed by verified safe positioning of the tip of the microcatheter prior to the embolization with the 250-300 microns, 350-500 microns and 500-700 microns PVA particles mixed with 50% contrast and 50% heparinized saline infusion. Again embolizations were performed until there was complete stasis or reflux to the tip of the microcatheter. Dangerous communications were eliminated with selective arteriograms in each of these vessels prior to the embolizations. At the end of the final embolization, microcatheters were retrieved and removed. Copious back bleed and aspiration was carried out above the 6 [place] MDP Brite tip Envoy guide catheter. This was then positioned in the left common carotid artery. Control arteriogram performed centered intracranially and extra  cranially demonstrated no intraluminal filling defects or occlusions in the intracranial compartment. There was complete angiographic stasis of contrast in the embolized left external carotid artery branches. The microcatheter was then retrieved and removed. The 6 [place] MDP Brite tip Envoy guide catheter was retrieved with the neurovascular sheath into the abdominal aorta and exchanged over a J-tip guidewire for 6 a [place] Pinnacle sheath. This in turn was then successfully replaced with the application of an external closure device successfully. The right groin appeared soft without evidence of a hematoma or bleeding. The distal pulses remained 2+ in the dorsalis pedis and in the posterior tibial regions bilaterally unchanged from prior to the procedure. The patient was then transported to the ICU for further medical management. At the end of the procedure the oropharynx appeared dry without evidence of bleeding. Also no bleeding was noted from the patient's nares. IMPRESSION: Status post super selective embolization of left and right external carotid artery branches as described above using PVA particles of sizes 250-300 microns, 350-500 microns and 500-700 microns with complete stasis of the super selectively embolized vessels bilaterally as described above. PLAN: Patient transferred to the medical ICU for further medical management. INDICATION: Recurrent copious oropharyngeal hemorrhage. Patient with lingual resection for lingual carcinoma. Electronically Signed   By: Luanne Bras M.D.   On: 05/07/2017 23:07    Microbiology: Recent Results (from the past 240 hour(s))  MRSA PCR Screening     Status: Joel Mathis   Collection Time: 05/04/17  1:57 AM  Result Value Ref Range Status   MRSA by PCR NEGATIVE NEGATIVE Final    Comment:        The GeneXpert MRSA Assay (FDA approved for NASAL specimens only), is one component of a comprehensive MRSA colonization surveillance program. It is not intended to  diagnose MRSA infection nor to guide or monitor treatment for MRSA infections.   Culture, blood (Routine X 2) w Reflex to ID Panel     Status: Joel Mathis   Collection Time: 05/05/17  1:27 PM  Result Value Ref Range Status   Specimen Description BLOOD  Final   Special Requests   Final    IN BOTH AEROBIC AND ANAEROBIC BOTTLES Blood Culture adequate volume   Culture NO GROWTH 5 DAYS  Final   Report Status 05/10/2017 FINAL  Final  Culture, blood (Routine X 2) w Reflex to ID Panel     Status: Joel Mathis   Collection Time: 05/05/17  6:15 PM  Result Value Ref Range Status   Specimen Description BLOOD LEFT HAND  Final   Special Requests IN PEDIATRIC BOTTLE  Blood Culture adequate volume  Final   Culture NO GROWTH 5 DAYS  Final   Report Status 05/10/2017 FINAL  Final     Labs: Basic Metabolic Panel: Recent Labs  Lab 05/05/17 0435 05/06/17 0442 05/07/17 0401 05/08/17 0429 05/09/17 0536 05/11/17 0333  NA 134* 131* 132* 132* 137 137  K 3.3* 4.1 3.9 3.5 3.5 3.7  CL 107 101 99* 100* 102 105  CO2 20* 21* 24 23 23 25   GLUCOSE 109* 114* 107* 94 115* 128*  BUN 6 6 8 10 8 6   CREATININE 0.89 0.87 0.86 0.88 0.91 0.97  CALCIUM 7.9* 8.3* 8.7* 8.5* 8.9 8.6*  MG 1.9 2.0 1.9 2.0  --   --   PHOS 1.9* 2.4*  --  2.7  --   --    Liver Function Tests: No results for input(s): AST, ALT, ALKPHOS, BILITOT, PROT, ALBUMIN in the last 168 hours. No results for input(s): LIPASE, AMYLASE in the last 168 hours. No results for input(s): AMMONIA in the last 168 hours. CBC: Recent Labs  Lab 05/05/17 0435 05/06/17 0442 05/07/17 0401 05/09/17 0536 05/11/17 0333  WBC 15.3* 18.9* 15.3* 8.9 9.1  NEUTROABS  --   --   --  6.2 7.0  HGB 9.5* 9.8* 9.9* 9.1* 9.0*  HCT 29.0* 30.6* 30.1* 28.1* 27.2*  MCV 78.8 80.1 79.6 79.4 79.5  PLT 211 247 314 392 459*   Cardiac Enzymes: No results for input(s): CKTOTAL, CKMB, CKMBINDEX, TROPONINI in the last 168 hours. BNP: BNP (last 3 results) No results for input(s): BNP in the  last 8760 hours.  ProBNP (last 3 results) No results for input(s): PROBNP in the last 8760 hours.  CBG: Recent Labs  Lab 05/06/17 1215 05/06/17 1738 05/06/17 1911 05/06/17 2304 05/07/17 0338  GLUCAP 103* 91 105* 98 110*       Signed:  Alma Friendly, MD [facility] 05/11/2017, 3:46 PM

## 2017-05-11 NOTE — Progress Notes (Signed)
[facility] called to verify patient still needing transfer. Confirmed need and patient continued on waiting list.

## 2017-05-11 NOTE — Plan of Care (Signed)
  Progressing Education: Knowledge of General Education information will improve 05/11/2017 0320 - Progressing by Anson Fret, RN Note POC reviewed with pt./ mother.   Not Progressing Pain Managment: General experience of comfort will improve 05/11/2017 0320 - Not Progressing by Anson Fret, RN Note Pt. experiencing increased pain and may benefit from long acting pain med in conjunction with prn.

## 2017-05-11 NOTE — Progress Notes (Signed)
Pt being transferred by [facility] to [facility] to resume care of post op lingual bleed.  Pt is alert and oriented x 4. VSS. Girlfriend at bedside has Pt belongings. Pt in no distress. Pain medication given before transport. Coretrak tube flushed and clamped prior to transport.

## 2017-11-25 ENCOUNTER — Encounter (HOSPITAL_COMMUNITY): Payer: Self-pay

## 2017-11-25 ENCOUNTER — Emergency Department (HOSPITAL_COMMUNITY)
Admission: EM | Admit: 2017-11-25 | Discharge: 2017-11-25 | Disposition: A | Discharge status: Home / Self Care | Payer: Medicaid Other | Attending: Emergency Medicine | Admitting: Emergency Medicine

## 2017-11-25 DIAGNOSIS — Z87891 Personal history of nicotine dependence: Secondary | ICD-10-CM | POA: Insufficient documentation

## 2017-11-25 DIAGNOSIS — R1310 Dysphagia, unspecified: Secondary | ICD-10-CM | POA: Diagnosis present

## 2017-11-25 DIAGNOSIS — F1410 Cocaine abuse, uncomplicated: Secondary | ICD-10-CM

## 2017-11-25 DIAGNOSIS — Z85810 Personal history of malignant neoplasm of tongue: Secondary | ICD-10-CM | POA: Diagnosis not present

## 2017-11-25 DIAGNOSIS — F1414 Cocaine abuse with cocaine-induced mood disorder: Secondary | ICD-10-CM | POA: Diagnosis present

## 2017-11-25 LAB — SALICYLATE LEVEL

## 2017-11-25 LAB — COMPREHENSIVE METABOLIC PANEL
ALT: 12 U/L (ref 0–44)
AST: 15 U/L (ref 15–41)
Albumin: 4 g/dL (ref 3.5–5.0)
Alkaline Phosphatase: 67 U/L (ref 38–126)
Anion gap: 11 (ref 5–15)
BUN: 12 mg/dL (ref 6–20)
CHLORIDE: 101 mmol/L (ref 98–111)
CO2: 25 mmol/L (ref 22–32)
Calcium: 9.3 mg/dL (ref 8.9–10.3)
Creatinine, Ser: 1.26 mg/dL — ABNORMAL HIGH (ref 0.61–1.24)
Glucose, Bld: 95 mg/dL (ref 70–99)
POTASSIUM: 3.2 mmol/L — AB (ref 3.5–5.1)
Sodium: 137 mmol/L (ref 135–145)
Total Bilirubin: 0.7 mg/dL (ref 0.3–1.2)
Total Protein: 7.5 g/dL (ref 6.5–8.1)

## 2017-11-25 LAB — CBC WITH DIFFERENTIAL/PLATELET
Basophils Absolute: 0 10*3/uL (ref 0.0–0.1)
Basophils Relative: 1 %
EOS PCT: 2 %
Eosinophils Absolute: 0.1 10*3/uL (ref 0.0–0.7)
HCT: 40.3 % (ref 39.0–52.0)
HEMOGLOBIN: 12.9 g/dL — AB (ref 13.0–17.0)
LYMPHS ABS: 1.6 10*3/uL (ref 0.7–4.0)
LYMPHS PCT: 30 %
MCH: 22.6 pg — AB (ref 26.0–34.0)
MCHC: 32 g/dL (ref 30.0–36.0)
MCV: 70.6 fL — AB (ref 78.0–100.0)
Monocytes Absolute: 0.5 10*3/uL (ref 0.1–1.0)
Monocytes Relative: 8 %
NEUTROS ABS: 3.2 10*3/uL (ref 1.7–7.7)
NEUTROS PCT: 59 %
PLATELETS: 285 10*3/uL (ref 150–400)
RBC: 5.71 MIL/uL (ref 4.22–5.81)
RDW: 15.3 % (ref 11.5–15.5)
WBC: 5.4 10*3/uL (ref 4.0–10.5)

## 2017-11-25 LAB — ACETAMINOPHEN LEVEL

## 2017-11-25 LAB — I-STAT TROPONIN, ED: Troponin i, poc: 0 ng/mL (ref 0.00–0.08)

## 2017-11-25 LAB — ETHANOL

## 2017-11-25 MED ORDER — ONDANSETRON HCL 4 MG PO TABS: 4.0000 mg | ORAL_TABLET | Freq: Three times a day (TID) | ORAL | Status: DC | PRN

## 2017-11-25 MED ORDER — ZOLPIDEM TARTRATE 5 MG PO TABS: 5.0000 mg | ORAL_TABLET | Freq: Every evening | ORAL | Status: DC | PRN

## 2017-11-25 MED ORDER — DULOXETINE HCL 30 MG PO CPEP
30.0000 mg | ORAL_CAPSULE | Freq: Every day | ORAL | Status: DC
  Administered 2017-11-25: 30 mg via ORAL
  Filled 2017-11-25: qty 1

## 2017-11-25 MED ORDER — IBUPROFEN 200 MG PO TABS: 600.0000 mg | ORAL_TABLET | Freq: Three times a day (TID) | ORAL | Status: DC | PRN

## 2017-11-25 NOTE — Discharge Instructions (Addendum)
To help you maintain a sober lifestyle, a substance abuse treatment program may be beneficial to you.  Contact [facility] at your earliest opportunity to ask about enrolling in their program:       [facility]      [place], [place] [address]      [phone]      New patients are seen at the walk-in clinic every Tuesday from 9:00 am - 12:00 pm  Your workup was reassuring. Please use resources. Follow up with primary care provider. Avoid drug use. Return for shortness of breath, chest pain or other concerning symptoms.

## 2017-11-25 NOTE — BH Assessment (Signed)
[facility] Assessment Progress Note  Per Buford Dresser, DO, this pt does not require psychiatric hospitalization at this time.  Pt is to be discharged from [facility].  Dscharge instructions advise pt to follow up with [facility].  Pt's nurse has been notified.  Jalene Mullet, [place] Triage Specialist [phone]

## 2017-11-25 NOTE — ED Triage Notes (Signed)
Pt arrived via [facility] in custody due to complaints of heart racing. Pt admits to cocaine use tonight, states he has been awake the lat two days. Pt has hx of mouth cancer, had partial tongue removed, complains of saliva built up in his mouth, complaint resolved after EMS suctioned.

## 2017-11-25 NOTE — BH Assessment (Signed)
[facility] Assessment Progress Note  Case was staffed with Mariea Clonts DO who recommended patient be discharged later this date after being medically cleared. Patient does not meet inpatient criteria.

## 2017-11-25 NOTE — ED Provider Notes (Signed)
[facility] DEPT Provider Note   CSN: [id number] Arrival date & time: 11/25/17  0341     History   Chief Complaint Chief Complaint  Patient presents with  . Dysphagia    Due to partial tounge removal     HPI BILAAL LEIB is a 37 y.o. male who presents the emergency department today for dysphasia and palpitations.  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  He notes since that time he is recently gotten out of jail and been on a binge of using cocaine over the last 5 days.  He notes that this morning was his last episode and he had associated palpitations with this.  Patient states that he had a mental breakdown and feels loss with life and has thoughts of harming himself.  He notes he does not have a plan.  He denies any attempts.  Patient notes he does not use any alcohol as it burns his mouth.  He denies any other drug use.  No over-the-counter medication use.  Patient states that he has some increased saliva at times due to difficulty swallowing after the tongue removal but denies any complaints at this current time for this.  Patient denies any associated headache, visual changes, numbness/tingling/weakness, fever, chills, chest pain, shortness of breath, cough, hemoptysis, abdominal pain, nausea/vomiting/diarrhea, constipation, melena, hematochezia, urinary symptoms. The patient denies any homicidal ideation. He denies history of psychiatric issues in the past.   HPI  Past Medical History:  Diagnosis Date  . Tongue cancer [facility])     Patient Active Problem List   Diagnosis Date Noted  . History of oral surgery 05/03/2017  . Epistaxis 05/03/2017  . Anemia 05/03/2017  . Acute post-hemorrhagic anemia   . Hemorrhagic shock ([place])   . Acute respiratory failure with hypoxia [facility])     Past Surgical History:  Procedure Laterality Date  . IR ANGIO EXTERNAL CAROTID SEL EXT CAROTID BILAT MOD SED   05/03/2017  . IR ANGIO EXTERNAL CAROTID SEL EXT CAROTID BILAT MOD SED  05/03/2017  . IR ANGIO INTRA EXTRACRAN SEL COM CAROTID INNOMINATE BILAT MOD SED  05/03/2017  . IR ANGIO INTRA EXTRACRAN SEL COM CAROTID INNOMINATE BILAT MOD SED  05/03/2017  . IR ANGIO VERTEBRAL SEL VERTEBRAL BILAT MOD SED  05/03/2017  . IR ANGIOGRAM FOLLOW UP STUDY  05/03/2017  . IR ANGIOGRAM FOLLOW UP STUDY  05/03/2017  . IR NEURO EACH ADD'L AFTER BASIC UNI LEFT (MS)  05/03/2017  . IR NEURO EACH ADD'L AFTER BASIC UNI LEFT (MS)  05/03/2017  . IR NEURO EACH ADD'L AFTER BASIC UNI RIGHT (MS)  05/03/2017  . IR NEURO EACH ADD'L AFTER BASIC UNI RIGHT (MS)  05/03/2017  . IR TRANSCATH/EMBOLIZ  05/03/2017  . IR TRANSCATH/EMBOLIZ  05/03/2017  . RADIOLOGY WITH ANESTHESIA N/A 05/03/2017   Procedure: IR WITH ANESTHESIA, angio of embolism;  Surgeon: Luanne Bras, MD;  Location: [facility];  Service: Radiology;  Laterality: N/A;  . RADIOLOGY WITH ANESTHESIA N/A 05/03/2017   Procedure: IR WITH ANESTHESIA;  Surgeon: Luanne Bras, MD;  Location: [facility];  Service: Radiology;  Laterality: N/A;  . TONGUE SURGERY          Home Medications    Prior to Admission medications   Medication Sig Start Date End Date Taking? Authorizing Provider  acetaminophen (TYLENOL) 500 MG tablet Take 1,000 mg by mouth every 6 (six) hours as needed for mild pain.  [provider]    Family History No family history on file.  Social History Social History   Tobacco Use  . Smoking status: Former Smoker    Types: Cigarettes  . Smokeless tobacco: Never Used  Substance Use Topics  . Alcohol use: Yes  . Drug use: Yes    Types: Cocaine     Allergies   Patient has no known allergies.   Review of Systems Review of Systems  All other systems reviewed and are negative.    Physical Exam Updated Vital Signs BP (!) 125/103 (BP Location: Right Arm)   Pulse 83   Temp 98.1 F (36.7 C) (Axillary)   Resp 19   Ht 6' (1.829 m)   Wt 77.1 kg  (170 lb)   SpO2 100%   BMI 23.06 kg/m   Physical Exam  Constitutional: He appears well-developed and well-nourished.  HENT:  Head: Normocephalic and atraumatic.  Right Ear: External ear normal.  Left Ear: External ear normal.  Nose: Nose normal.  Mouth/Throat: Uvula is midline, oropharynx is clear and moist and mucous membranes are normal. No tonsillar exudate.  Tongue absent  Eyes: Pupils are equal, round, and reactive to light. Right eye exhibits no discharge. Left eye exhibits no discharge. No scleral icterus.  Neck: Trachea normal. Neck supple. No spinous process tenderness present. No neck rigidity. Normal range of motion present.  Cardiovascular: Normal rate, regular rhythm and intact distal pulses.  No murmur heard. Pulses:      Radial pulses are 2+ on the right side, and 2+ on the left side.       Dorsalis pedis pulses are 2+ on the right side, and 2+ on the left side.       Posterior tibial pulses are 2+ on the right side, and 2+ on the left side.  No lower extremity swelling or edema. Calves symmetric in size bilaterally.  Pulmonary/Chest: Effort normal and breath sounds normal. He exhibits no tenderness.  Abdominal: Soft. Bowel sounds are normal. There is no tenderness. There is no rebound and no guarding.  Musculoskeletal: He exhibits no edema.  Lymphadenopathy:    He has no cervical adenopathy.  Neurological: He is alert.  Skin: Skin is warm and dry. No rash noted. He is not diaphoretic.  Psychiatric: He has a normal mood and affect.  Nursing note and vitals reviewed.    ED Treatments / Results  Labs (all labs ordered are listed, but only abnormal results are displayed) Labs Reviewed - No data to display  EKG None  Radiology No results found.  Procedures Procedures (including critical care time)  Medications Ordered in ED Medications - No data to display   Initial Impression / Assessment and Plan / ED Course  I have reviewed the triage vital signs  and the nursing notes.  Pertinent labs & imaging results that were available during my care of the patient were reviewed by me and considered in my medical decision making (see chart for details).      38 y.o. male who presents the emergency department today for dysphasia and palpitations.  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  He notes since that time he is recently gotten out of jail and been on a binge of using cocaine over the last 5 days.  He notes that this morning was his last episode and he had associated palpitations with this.  Patient states that he had a mental breakdown  and feels loss with life and has thoughts of harming himself.  He notes he does not have a plan.  He denies any attempts.  Patient notes he does not use any alcohol as it burns his mouth.  He denies any other drug use.  No over-the-counter medication use.  Patient states that he has some increased saliva at times due to difficulty swallowing after the tongue removal but denies any complaints at this current time for this.  Patient denies any associated headache, visual changes, numbness/tingling/weakness, fever, chills, chest pain, shortness of breath, cough, hemoptysis, abdominal pain, nausea/vomiting/diarrhea, constipation, melena, hematochezia, urinary symptoms. The patient denies any homicidal ideation. He denies history of psychiatric issues in the past.   Labs & EKG reassuring. Patient is currently asymptomatic.  Patient is medically cleared.  Consult TTS is pending.  Home medications reordered. Patient moved to psych hold.   TTS is clear the patient.  Patient given outpatient resources. The evaluation does not show pathology that would require ongoing emergent intervention or inpatient treatment. I advised the patient to follow-up with PCP this week. I advised the patient to return to the emergency department with new or worsening symptoms or new concerns. Specific  return precautions discussed. The patient verbalized understanding and agreement with plan. All questions answered. No further questions at this time. The patient is hemodynamically stable, mentating appropriately and appears safe for discharge.  Final Clinical Impressions(s) / ED Diagnoses   Final diagnoses:  History of tongue cancer  Cocaine abuse [facility])    ED Discharge Orders    None       Lorelle Gibbs 11/25/17 [place], Ankit, MD 12/03/17 [phone]

## 2017-11-25 NOTE — BH Assessment (Signed)
Assessment Note  Joel Mathis is an 37 y.o. male that presents this date "wanting someone to talk to." Patient renders conflicting history per note review, patient stated on admission he had thoughts of self harm but denies any S/I, H/I or AVH at this time. Patient denies any previous attempts/gestures at self harm or prior psychiatric diagnosis. Patient is observed to be very guarded and renders limited information. Patient declines to answer any questions in reference to his SA history but does state he uses cocaine. Patient admits to using earlier this date but will not elaborate on history. UDS is pending at this time. Patient is difficult to understand at times due to his medical condition (Hx of mouth cancer). Patient is oriented x 4 and is observed to be partially impaired. This writer attempts to interact/probe on the statement patient made earlier this date in reference to "wanting someone to talk to." Patient declines to further elaborate on statement and continues to be very guarded. Per notes this date, "Patient presents to the emergency department today for dysphasia and palpitations. 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 He notes since that time he is recently gotten out of jail and been on a binge of using cocaine over the last 5 days. He notes that this morning was his last episode and he had associated palpitations with this. Patient states that he had a mental breakdown and feels loss with life and has thoughts of harming himself. He notes he does not have a plan. He denies any attempts. Patient notes he does not use any alcohol as it burns his mouth. He denies any other drug use. The patient denies any homicidal ideation. He denies history of psychiatric issues in the past". Case was staffed with Mariea Clonts DO who recommended patient be discharged later this date after being medically cleared. Patient does not meet inpatient  criteria.   Diagnosis: F14.18 Cocaine abuse   Past Medical History:  Past Medical History:  Diagnosis Date  . Tongue cancer [facility])     Past Surgical History:  Procedure Laterality Date  . IR ANGIO EXTERNAL CAROTID SEL EXT CAROTID BILAT MOD SED  05/03/2017  . IR ANGIO EXTERNAL CAROTID SEL EXT CAROTID BILAT MOD SED  05/03/2017  . IR ANGIO INTRA EXTRACRAN SEL COM CAROTID INNOMINATE BILAT MOD SED  05/03/2017  . IR ANGIO INTRA EXTRACRAN SEL COM CAROTID INNOMINATE BILAT MOD SED  05/03/2017  . IR ANGIO VERTEBRAL SEL VERTEBRAL BILAT MOD SED  05/03/2017  . IR ANGIOGRAM FOLLOW UP STUDY  05/03/2017  . IR ANGIOGRAM FOLLOW UP STUDY  05/03/2017  . IR NEURO EACH ADD'L AFTER BASIC UNI LEFT (MS)  05/03/2017  . IR NEURO EACH ADD'L AFTER BASIC UNI LEFT (MS)  05/03/2017  . IR NEURO EACH ADD'L AFTER BASIC UNI RIGHT (MS)  05/03/2017  . IR NEURO EACH ADD'L AFTER BASIC UNI RIGHT (MS)  05/03/2017  . IR TRANSCATH/EMBOLIZ  05/03/2017  . IR TRANSCATH/EMBOLIZ  05/03/2017  . RADIOLOGY WITH ANESTHESIA N/A 05/03/2017   Procedure: IR WITH ANESTHESIA, angio of embolism;  Surgeon: Luanne Bras, MD;  Location: [place];  Service: Radiology;  Laterality: N/A;  . RADIOLOGY WITH ANESTHESIA N/A 05/03/2017   Procedure: IR WITH ANESTHESIA;  Surgeon: Luanne Bras, MD;  Location: [place];  Service: Radiology;  Laterality: N/A;  . TONGUE SURGERY      Family History: No family history on file.  Social History:  reports that he  has quit smoking. His smoking use included cigarettes. He has never used smokeless tobacco. He reports that he drinks alcohol. He reports that he has current or past drug history. Drug: Cocaine.  Additional Social History:  Alcohol / Drug Use Pain Medications: See MAR Prescriptions: See MAR Over the Counter: See MAR History of alcohol / drug use?: Yes Longest period of sobriety (when/how long): Unknown Negative Consequences of Use: (Denies) Withdrawal Symptoms: (Denies) Substance #1 Name of Substance  1: Cocaine 1 - Age of First Use: 25 1 - Amount (size/oz): Pt refuses to answer 1 - Frequency: Pt refuses to answer 1 - Duration: Pt refuses to answer 1 - Last Use / Amount: Pt refuses to answer UDS pending  CIWA: CIWA-Ar BP: (!) 125/103 Pulse Rate: 83 COWS:    Allergies: No Known Allergies  Home Medications:  (Not in a hospital admission)  OB/GYN Status:  No LMP for male patient.  General Assessment Data [facility] of Assessment: [facility] TTS Assessment: In system Is this a Tele or Face-to-Face Assessment?: Face-to-Face Is this an Initial Assessment or a Re-assessment for this encounter?: Initial Assessment Marital status: Single Maiden name: NA Is patient pregnant?: No Pregnancy Status: No Living Arrangements: Non-relatives/Friends Can pt return to current living arrangement?: Yes Admission Status: Voluntary Is patient capable of signing voluntary admission?: Yes Referral Source: Self/Family/Friend Insurance type: Self Pay  Medical Screening Exam ([place]) Medical Exam completed: Yes  Crisis Care Plan Living Arrangements: Non-relatives/Friends Legal Guardian: (NA) Name of Psychiatrist: None Name of Therapist: None   Education Status Is patient currently in school?: No Is the patient employed, unemployed or receiving disability?: Unemployed  Risk to self with the past 6 months Suicidal Ideation: No Has patient been a risk to self within the past 6 months prior to admission? : No Suicidal Intent: No Has patient had any suicidal intent within the past 6 months prior to admission? : No Is patient at risk for suicide?: No Suicidal Plan?: No Has patient had any suicidal plan within the past 6 months prior to admission? : No Access to Means: No What has been your use of drugs/alcohol within the last 12 months?: Current use Previous Attempts/Gestures: No How many times?: 0 Other Self Harm Risks: NA Triggers for Past Attempts: Unknown Intentional Self  Injurious Behavior: None Family Suicide History: No Recent stressful life event(s): Other (Comment)(Pt has been incarcerated) Persecutory voices/beliefs?: No Depression: No Depression Symptoms: (NA) Substance abuse history and/or treatment for substance abuse?: No Suicide prevention information given to non-admitted patients: Not applicable  Risk to Others within the past 6 months Homicidal Ideation: No Does patient have any lifetime risk of violence toward others beyond the six months prior to admission? : (Unknown patient refuses to answer) Thoughts of Harm to Others: No Current Homicidal Intent: No Current Homicidal Plan: No Access to Homicidal Means: No Identified Victim: NA History of harm to others?: (Unknown pt refuses to answer) Assessment of Violence: (Unknown pt refuses to answer) Violent Behavior Description: NA Does patient have access to weapons?: Yes (Comment)(Per [employer] collateral pt had a weapon earlier this date) Criminal Charges Pending?: Yes Describe Pending Criminal Charges: [place] possession of firearm  Does patient have a court date: No Is patient on probation?: No  Psychosis Hallucinations: None noted Delusions: None noted  Mental Status Report Appearance/Hygiene: Unremarkable Eye Contact: Fair Motor Activity: Freedom of movement Speech: Slow(Incoherent at times) Level of Consciousness: Quiet/awake Mood: Pleasant Affect: Appropriate to circumstance Anxiety Level: Minimal Thought Processes: Coherent,  Relevant Judgement: Partial Orientation: Person, Place, Time Obsessive Compulsive Thoughts/Behaviors: None  Cognitive Functioning Concentration: Good Memory: Recent Intact, Remote Intact Is patient IDD: No Is patient DD?: No Insight: Fair Impulse Control: Poor Appetite: (UTA pt refuses to answer) Have you had any weight changes? : (UTA) Sleep: (UTA) Total Hours of Sleep: (UTA) Vegetative Symptoms: None  ADLScreening [facility] Assessment  Services) Patient's cognitive ability adequate to safely complete daily activities?: Yes Patient able to express need for assistance with ADLs?: Yes Independently performs ADLs?: Yes (appropriate for developmental age)  Prior Inpatient Therapy Prior Inpatient Therapy: No  Prior Outpatient Therapy Prior Outpatient Therapy: No Does patient have an ACCT team?: No Does patient have Intensive In-House Services?  : No Does patient have [facility] services? : No Does patient have P4CC services?: No  ADL Screening (condition at time of admission) Patient's cognitive ability adequate to safely complete daily activities?: Yes Is the patient deaf or have difficulty hearing?: No Does the patient have difficulty seeing, even when wearing glasses/contacts?: No Does the patient have difficulty concentrating, remembering, or making decisions?: No Patient able to express need for assistance with ADLs?: Yes Does the patient have difficulty dressing or bathing?: No Independently performs ADLs?: Yes (appropriate for developmental age) Does the patient have difficulty walking or climbing stairs?: No Weakness of Legs: None Weakness of Arms/Hands: None  Home Assistive Devices/Equipment Home Assistive Devices/Equipment: None  Therapy Consults (therapy consults require a physician order) PT Evaluation Needed: No OT Evalulation Needed: No SLP Evaluation Needed: No Abuse/Neglect Assessment (Assessment to be complete while patient is alone) Physical Abuse: Denies Verbal Abuse: Denies Sexual Abuse: Denies Exploitation of patient/patient's resources: Denies Self-Neglect: Denies Values / Beliefs Cultural Requests During Hospitalization: None Spiritual Requests During Hospitalization: None Consults Spiritual Care Consult Needed: No Social Work Consult Needed: No Regulatory affairs officer (For Healthcare) Does Patient Have a Medical Advance Directive?: No Would patient like information on creating a medical  advance directive?: No - Patient declined    Additional Information 1:1 In Past 12 Months?: No CIRT Risk: No Elopement Risk: No Does patient have medical clearance?: Yes     Disposition: Case was staffed with Mariea Clonts DO who recommended patient be discharged later this date after being medically cleared. Patient does not meet inpatient criteria.   Disposition Initial Assessment Completed for this Encounter: Yes Disposition of Patient: (Pt will be discharged later this date) Patient refused recommended treatment: No Mode of transportation if patient is discharged?: [facility])  On Site Evaluation by:   Reviewed with Physician:    Mamie Nick 11/25/2017 10:04 AM

## 2017-11-25 NOTE — ED Notes (Signed)
Dave TTS in to talk with pt, he denies both SI and HI. Due to this he will speak with his team and [place]. At that time it will be decided if he needs to got to [facility] or discharged.

## 2018-02-01 IMAGING — CT CT ANGIO NECK
1 of 7 series · 7 of 33 positions shown · IV contrast (APPLIED)
Comparison: Soft tissue neck CT 01/04/2014

CLINICAL DATA: Surgery for a tongue mass 2 weeks ago.  Hematemesis.

EXAM:
CT ANGIOGRAPHY NECK
TECHNIQUE: Multidetector CT imaging of the neck was performed using the
standard protocol during bolus administration of intravenous
contrast. Multiplanar CT image reconstructions and MIPs were
obtained to evaluate the vascular anatomy. Carotid stenosis
measurements (when applicable) are obtained utilizing NASCET
criteria, using the distal internal carotid diameter as the
denominator.
CONTRAST:  50mL 8DXELR-WXL IOPAMIDOL (8DXELR-WXL) INJECTION 76%

[Series 8: ax thins · axial · 0.39mm/px · z∈[-360,-187]mm · 7 of 266 slices shown]
[im 34/266  soft-tissue]
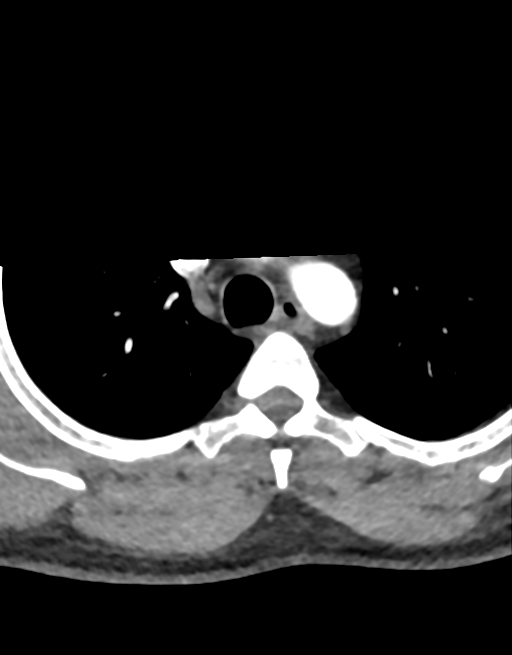
[im 67/266  bone]
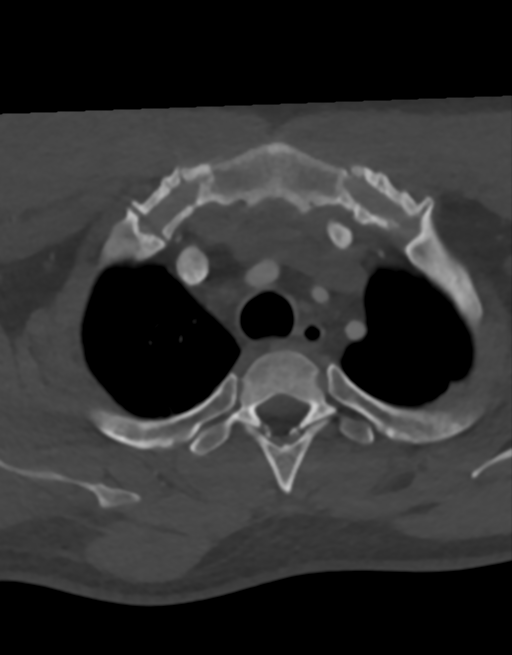
[im 100/266  soft-tissue]
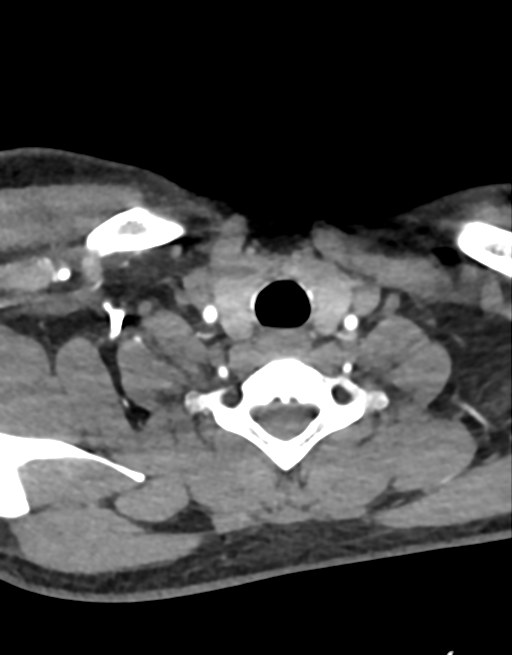
[im 133/266  bone]
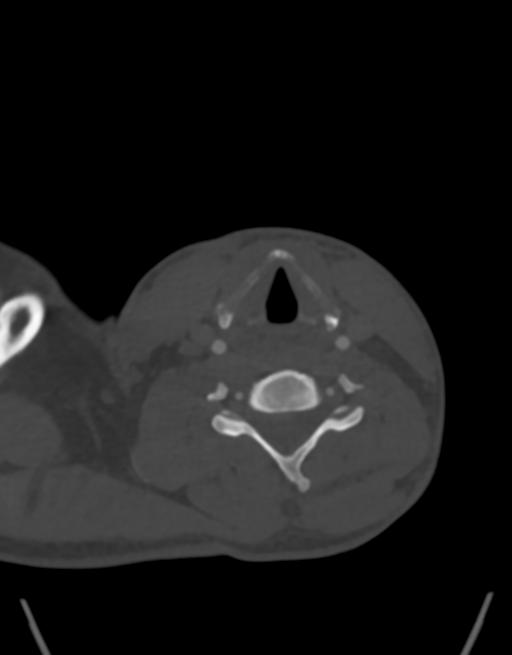
[im 166/266  soft-tissue]
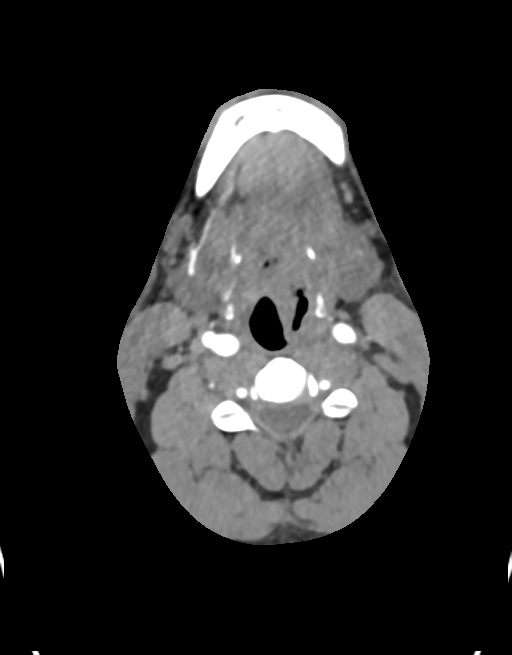
[im 199/266  bone]
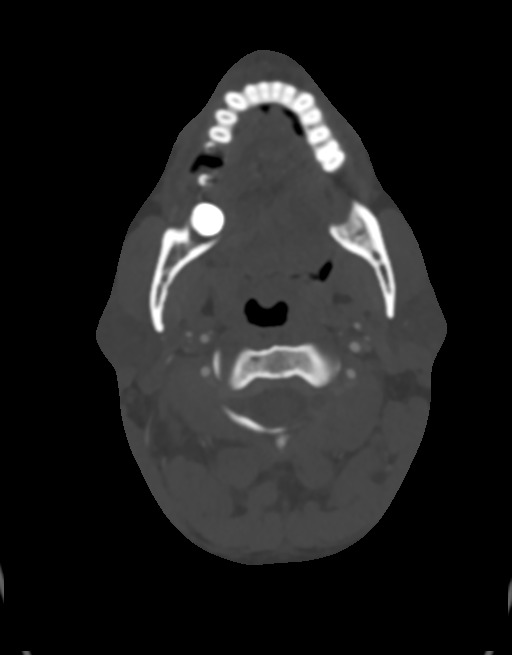
[im 232/266  soft-tissue]
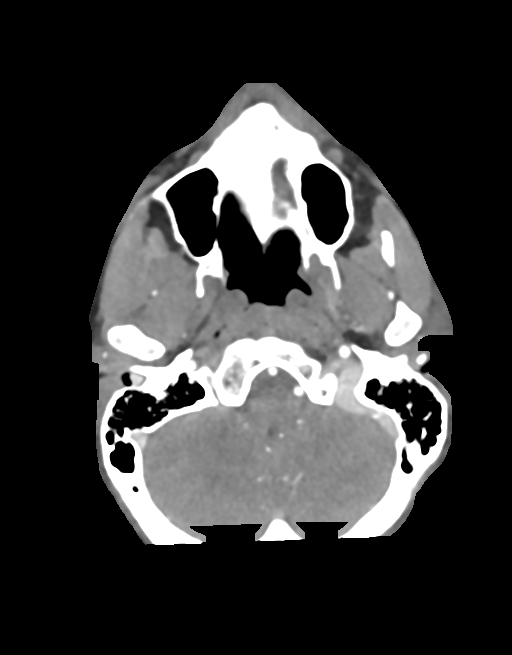

[7 of 33 positions shown; findings below may reference images not displayed]

FINDINGS: Aortic arch: Standard 3 vessel aortic arch. Brachiocephalic and
subclavian arteries are widely patent.

Right carotid system: Patent without evidence of stenosis,
dissection, or aneurysm.

Left carotid system: Patent without evidence of stenosis,
dissection, or aneurysm.

Vertebral arteries: Patent and codominant without evidence of
stenosis or dissection.

Skeleton: No acute osseous abnormality or suspicious osseous lesion.

Other neck: There is a postoperative defect in the left tongue base
consistent with history of recent tumor resection. There is some
attenuation of the left lingual artery in the operative region,
however no active extravasation or pseudoaneurysm is identified.
There is some asymmetric soft tissue fullness/swelling laterally in
the left oropharynx. This examination was not tailored to assess the
soft tissues, and correlation with any more recent outside
preoperative imaging is suggested. A 9 mm short axis right level IIa
lymph node is unchanged from 1904.

Upper chest: Partially visualized patchy ground-glass opacities in
the posterior right upper lobe.
IMPRESSION: 1. Postoperative changes from recent tongue base mass resection. No
active arterial extravasation or pseudoaneurysm identified in this
region.
2. Partially visualized pulmonary ground-glass opacities in the
right upper lobe which could represent infection or hemorrhage.

## 2018-02-03 IMAGING — DX DG CHEST 1V PORT
1 series · 1 of 1 positions shown · non-contrast
Comparison: 05/04/2017

CLINICAL DATA: 30-year-old with male status post intubation.

EXAM:
PORTABLE CHEST 1 VIEW

[chest ap]
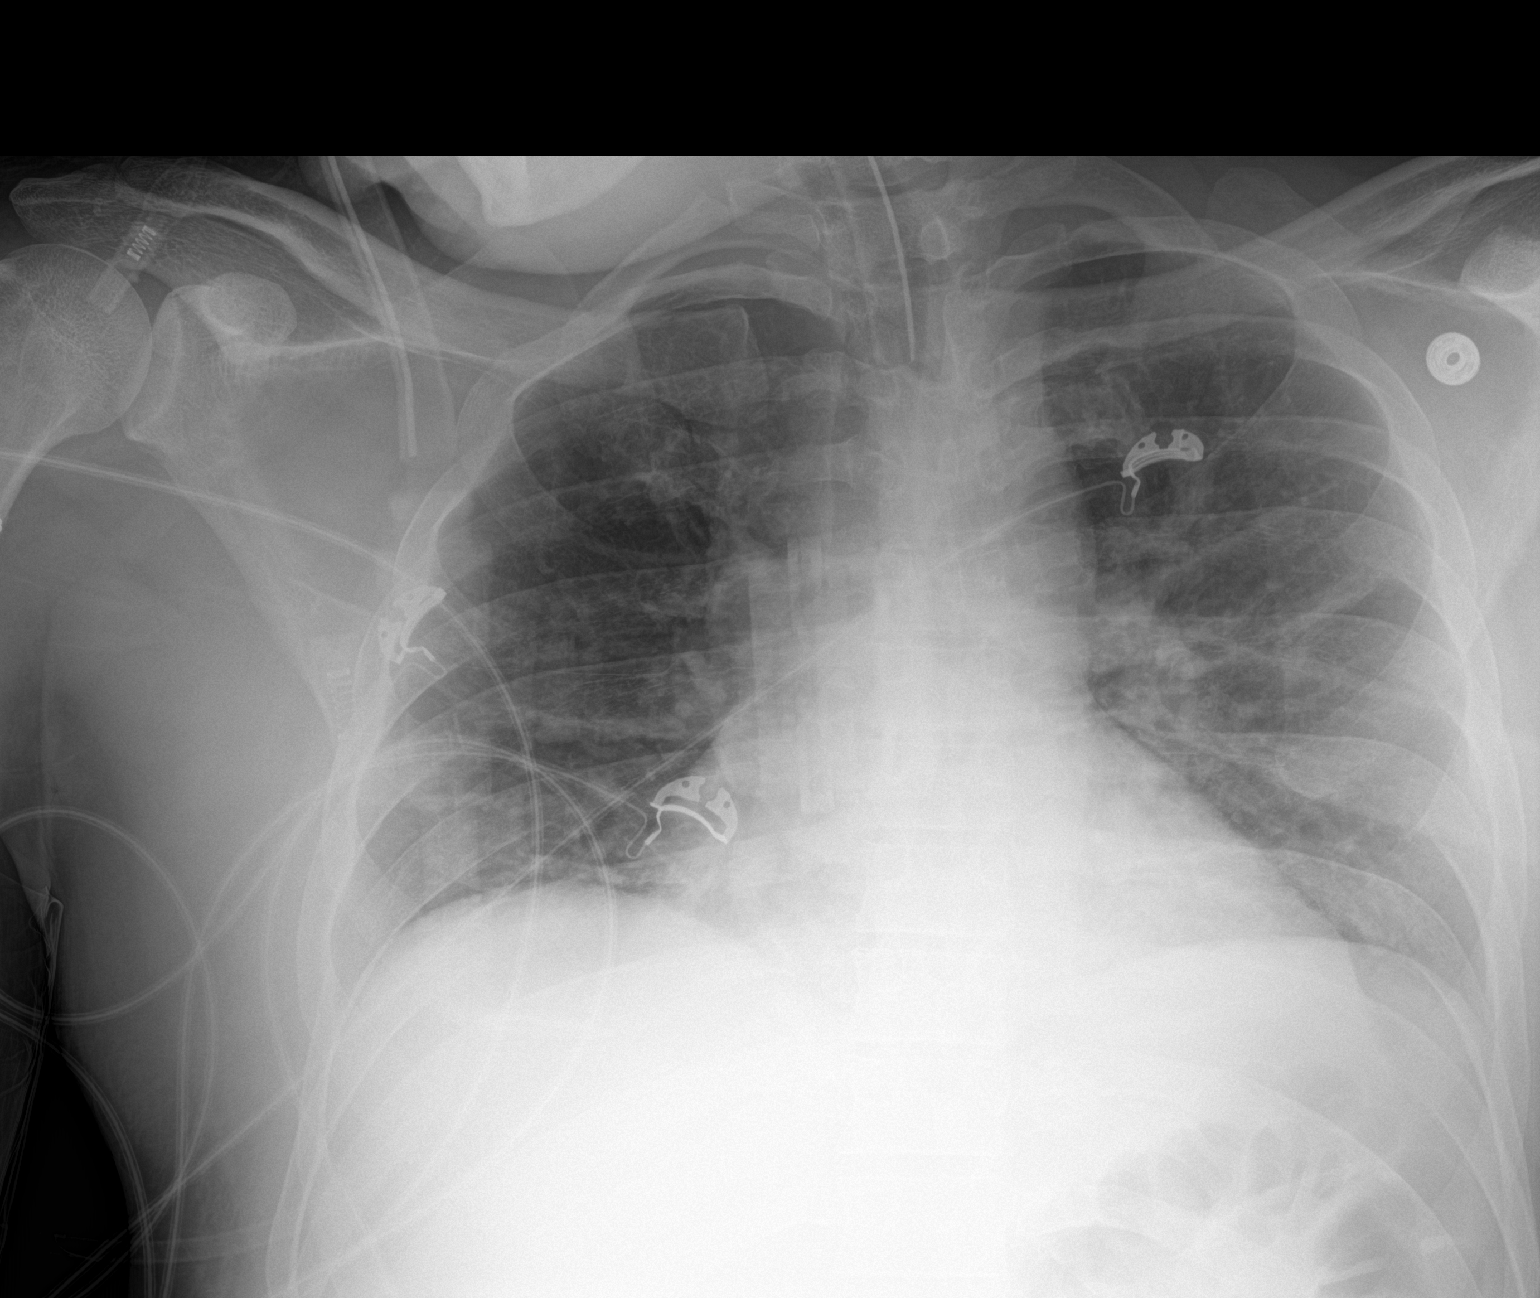

[1 of 1 positions shown; findings below may reference images not displayed]

FINDINGS: Endotracheal tube again terminates approximately 5 cm above the
level of the carina. Cardiomediastinal silhouette is unchanged.

Note is made of low lung volumes with bronchovascular crowding. No
focal consolidation, sizable effusion or pneumothorax.

No acute osseous abnormalities.
IMPRESSION: Stable appearance of the chest with endotracheal tube in place.
Persistent low lung volumes and bibasilar atelectasis.

## 2018-02-08 IMAGING — RF DG ABDOMEN 1V
1 series · 1 of 1 positions shown · non-contrast
Comparison: Neck CTA 05/03/2017.

CLINICAL DATA: 36-year-old male status post feeding tube placement
under fluoroscopy. Recent surgery for tongue mass.

EXAM:
ABDOMEN - 1 VIEW

[Series 1: cp_standard · 0.32mm/px · 1 of 1 slices shown]
[im 1/1]
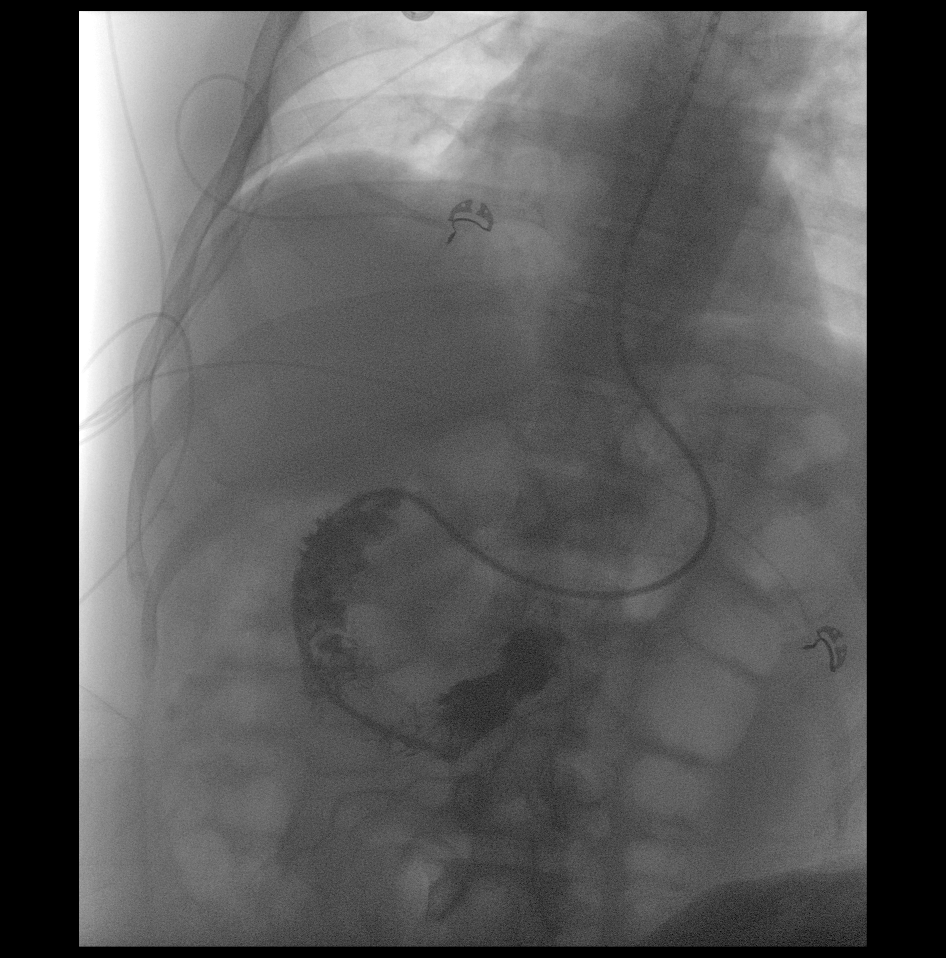

[1 of 1 positions shown; findings below may reference images not displayed]

FINDINGS: Single fluoroscopic image of the lower chest and upper abdomen
demonstrating a feeding tube in place coursing through the stomach
and to the distal duodenum. Injected oral contrast outlines the
duodenal mucosa.
IMPRESSION: Enteric feeding tube placed into the distal duodenum with
fluoroscopic assistance.

## 2018-02-09 ENCOUNTER — Emergency Department (HOSPITAL_COMMUNITY)
Admission: EM | Admit: 2018-02-09 | Discharge: 2018-02-09 | Disposition: A | Discharge status: Home / Self Care | Payer: Medicaid Other | Attending: Emergency Medicine | Admitting: Emergency Medicine

## 2018-02-09 ENCOUNTER — Encounter (HOSPITAL_COMMUNITY): Payer: Self-pay | Admitting: Emergency Medicine

## 2018-02-09 DIAGNOSIS — S70371A Other superficial bite of right thigh, initial encounter: Secondary | ICD-10-CM | POA: Diagnosis present

## 2018-02-09 DIAGNOSIS — Z79899 Other long term (current) drug therapy: Secondary | ICD-10-CM | POA: Diagnosis not present

## 2018-02-09 DIAGNOSIS — Y92018 Other place in single-family (private) house as the place of occurrence of the external cause: Secondary | ICD-10-CM | POA: Diagnosis not present

## 2018-02-09 DIAGNOSIS — S71152A Open bite, left thigh, initial encounter: Secondary | ICD-10-CM | POA: Diagnosis not present

## 2018-02-09 DIAGNOSIS — Z85810 Personal history of malignant neoplasm of tongue: Secondary | ICD-10-CM | POA: Insufficient documentation

## 2018-02-09 DIAGNOSIS — Z87891 Personal history of nicotine dependence: Secondary | ICD-10-CM | POA: Insufficient documentation

## 2018-02-09 DIAGNOSIS — Y9389 Activity, other specified: Secondary | ICD-10-CM | POA: Diagnosis not present

## 2018-02-09 DIAGNOSIS — Y998 Other external cause status: Secondary | ICD-10-CM | POA: Diagnosis not present

## 2018-02-09 DIAGNOSIS — S71151A Open bite, right thigh, initial encounter: Secondary | ICD-10-CM | POA: Insufficient documentation

## 2018-02-09 DIAGNOSIS — W540XXA Bitten by dog, initial encounter: Secondary | ICD-10-CM | POA: Insufficient documentation

## 2018-02-09 DIAGNOSIS — F1410 Cocaine abuse, uncomplicated: Secondary | ICD-10-CM | POA: Diagnosis not present

## 2018-02-09 MED ORDER — AMOXICILLIN-POT CLAVULANATE 400-57 MG/5ML PO SUSR
800.0000 mg | Freq: Once | ORAL | Status: AC
  Administered 2018-02-09: 800 mg via ORAL
  Filled 2018-02-09: qty 10

## 2018-02-09 MED ORDER — ACETAMINOPHEN 160 MG/5ML PO SOLN
650.0000 mg | Freq: Once | ORAL | Status: AC
  Administered 2018-02-09: 650 mg via ORAL
  Filled 2018-02-09: qty 20.3

## 2018-02-09 MED ORDER — AMOXICILLIN-POT CLAVULANATE 400-57 MG/5ML PO SUSR: 800.0000 mg | Freq: Two times a day (BID) | ORAL | 0 refills | Status: AC

## 2018-02-09 MED ORDER — IBUPROFEN 100 MG/5ML PO SUSP
600.0000 mg | Freq: Once | ORAL | Status: AC
  Administered 2018-02-09: 600 mg via ORAL
  Filled 2018-02-09: qty 30

## 2018-02-09 NOTE — ED Triage Notes (Addendum)
Per EMS, pt. From home reported of dog bite by a Police dog at 0459 this morning. Pt. Stated that he went out of his house to get food and got bitten by a police dog who happened to have operations around the area. Pt. Has 3 punctured wounds/ bites , on right lateral  and medial thigh and one area on left posterior thigh. Pt. Has his tongue removed s/p CA ,speech was not clear but alert and oriented x4.  not on any distress upon arrival to ED.

## 2018-02-09 NOTE — ED Notes (Signed)
Bed: TV81 Expected date:  Expected time:  Means of arrival:  Comments: 02V Bit by a police dog

## 2018-02-09 NOTE — ED Provider Notes (Signed)
[place] DEPT Provider Note   CSN: [id number] Arrival date & time: 02/09/18  0201  Time seen 04:20 AM   History   Chief Complaint No chief complaint on file.   HPI Joel Mathis is a 37 y.o. male.  HPI patient states about 1 AM he stepped outside of his house to go get pizza and a police dog that was loose, not on a leash came up and bit him on the right and left thigh.  Patient states his last tetanus was less than 5 years ago.  PCP Patient, No Pcp Per   Past Medical History:  Diagnosis Date  . Tongue cancer [facility])     Patient Active Problem List   Diagnosis Date Noted  . Cocaine abuse with cocaine-induced mood disorder ([place]) 11/25/2017  . History of oral surgery 05/03/2017  . Epistaxis 05/03/2017  . Anemia 05/03/2017  . Acute post-hemorrhagic anemia   . Hemorrhagic shock ([place])   . Acute respiratory failure with hypoxia [facility])     Past Surgical History:  Procedure Laterality Date  . IR ANGIO EXTERNAL CAROTID SEL EXT CAROTID BILAT MOD SED  05/03/2017  . IR ANGIO EXTERNAL CAROTID SEL EXT CAROTID BILAT MOD SED  05/03/2017  . IR ANGIO INTRA EXTRACRAN SEL COM CAROTID INNOMINATE BILAT MOD SED  05/03/2017  . IR ANGIO INTRA EXTRACRAN SEL COM CAROTID INNOMINATE BILAT MOD SED  05/03/2017  . IR ANGIO VERTEBRAL SEL VERTEBRAL BILAT MOD SED  05/03/2017  . IR ANGIOGRAM FOLLOW UP STUDY  05/03/2017  . IR ANGIOGRAM FOLLOW UP STUDY  05/03/2017  . IR NEURO EACH ADD'L AFTER BASIC UNI LEFT (MS)  05/03/2017  . IR NEURO EACH ADD'L AFTER BASIC UNI LEFT (MS)  05/03/2017  . IR NEURO EACH ADD'L AFTER BASIC UNI RIGHT (MS)  05/03/2017  . IR NEURO EACH ADD'L AFTER BASIC UNI RIGHT (MS)  05/03/2017  . IR TRANSCATH/EMBOLIZ  05/03/2017  . IR TRANSCATH/EMBOLIZ  05/03/2017  . RADIOLOGY WITH ANESTHESIA N/A 05/03/2017   Procedure: IR WITH ANESTHESIA, angio of embolism;  Surgeon: Luanne Bras, MD;  Location: [place];  Service: Radiology;  Laterality: N/A;  . RADIOLOGY WITH  ANESTHESIA N/A 05/03/2017   Procedure: IR WITH ANESTHESIA;  Surgeon: Luanne Bras, MD;  Location: [place];  Service: Radiology;  Laterality: N/A;  . TONGUE SURGERY          Home Medications    Prior to Admission medications   Medication Sig Start Date End Date Taking? Authorizing Provider  amoxicillin-clavulanate (AUGMENTIN) 400-57 MG/5ML suspension Take 10 mLs (800 mg total) by mouth 2 (two) times daily for 7 days. 02/09/18 02/16/18  Rolland Porter, MD  DULoxetine (CYMBALTA) 30 MG capsule Take 30 mg by mouth daily.    [provider]  HYDROCODONE-ACETAMINOPHEN PO Take 5 mLs by mouth every 6 (six) hours as needed (oral pain).    [provider]  Multiple Vitamins-Minerals (MULTIVITAMIN ADULT PO) Take 1 tablet by mouth daily.    [provider]  Zolpidem Tartrate (AMBIEN PO) Take 1 tablet by mouth at bedtime as needed (oral pain).    [provider]    Family History History reviewed. No pertinent family history.  Social History Social History   Tobacco Use  . Smoking status: Former Smoker    Types: Cigarettes  . Smokeless tobacco: Never Used  Substance Use Topics  . Alcohol use: Yes  . Drug use: Yes    Types: Cocaine     Allergies  Patient has no known allergies.   Review of Systems Review of Systems  All other systems reviewed and are negative.    Physical Exam Updated Vital Signs BP 117/82 (BP Location: Right Arm)   Pulse 84   Temp 98.7 F (37.1 C) (Oral)   Resp 18   SpO2 100%   Vital signs normal    Physical Exam  Constitutional: He appears well-developed and well-nourished. He appears distressed.  HENT:  Head: Normocephalic and atraumatic.  Right Ear: External ear normal.  Left Ear: External ear normal.  Nose: Nose normal.  Patient speech is difficult to understand  Eyes: Conjunctivae and EOM are normal.  Neck: Normal range of motion.  Cardiovascular: Normal rate.  Pulmonary/Chest: Effort normal. No respiratory  distress.  Musculoskeletal: He exhibits tenderness.  Patient is noted to have a puncture wound on his left posterior lateralproximal thigh that is deep without bleeding.  It is 1 x 1-1/2 cm in size. Patient is noted to have a linear superficial laceration on his anterior right thigh that is 5 cm in length.  There are 2 small superficial puncture wounds medial to that.  There is also a 1 cm posterior puncture wound on the posterior distal lateral right thigh.  Nursing note and vitals reviewed.   Right lateral thigh   Right thigh   Left thigh     ED Treatments / Results  Labs (all labs ordered are listed, but only abnormal results are displayed) Labs Reviewed - No data to display  EKG None  Radiology No results found.  Procedures Procedures (including critical care time)  Medications Ordered in ED Medications  ibuprofen (ADVIL,MOTRIN) 100 MG/5ML suspension 600 mg (has no administration in time range)  acetaminophen (TYLENOL) solution 650 mg (has no administration in time range)  amoxicillin-clavulanate (AUGMENTIN) 400-57 MG/5ML suspension 800 mg (800 mg Oral Given 02/09/18 0528)     Initial Impression / Assessment and Plan / ED Course  I have reviewed the triage vital signs and the nursing notes.  Pertinent labs & imaging results that were available during my care of the patient were reviewed by me and considered in my medical decision making (see chart for details).    Patient's wounds were cleaned.  He was started on Augmentin for prophylaxis for infection.  The puncture wounds were not sutured due to risk of infection.  Patient understands that they will need to heal without suturing.  He can take liquid Motrin or Tylenol for pain, patient is unable to swallow pills due to prior oral surgery.  Final Clinical Impressions(s) / ED Diagnoses   Final diagnoses:  Dog bite of left thigh, initial encounter  Dog bite of right thigh, initial encounter    ED Discharge  Orders         Ordered    amoxicillin-clavulanate (AUGMENTIN) 400-57 MG/5ML suspension  2 times daily     02/09/18 0544        OTC ibuprofen and acetaminophen  Plan discharge  Rolland Porter, MD, Barbette Or, MD 02/09/18 [phone]

## 2018-02-09 NOTE — Discharge Instructions (Signed)
Keep the wounds clean and dry, clean daily with chest soap and water.  Use antibiotic ointment on the areas to help prevent infection.  Take the antibiotics for 7 days as preventative for infection.  Return to the ED if you get fever, increased redness or swelling around the wounds, the wound start draining pus, you get fever or chills.

## 2019-05-26 ENCOUNTER — Ambulatory Visit: Payer: Medicaid Other | Attending: Internal Medicine

## 2019-05-26 DIAGNOSIS — Z20822 Contact with and (suspected) exposure to covid-19: Secondary | ICD-10-CM

## 2019-05-28 LAB — NOVEL CORONAVIRUS, NAA: SARS-CoV-2, NAA: NOT DETECTED

## 2019-07-07 ENCOUNTER — Ambulatory Visit: Payer: Medicaid Other

## 2019-08-29 ENCOUNTER — Other Ambulatory Visit: Payer: Self-pay

## 2019-08-29 ENCOUNTER — Encounter (HOSPITAL_COMMUNITY): Payer: Self-pay | Admitting: Emergency Medicine

## 2019-08-29 ENCOUNTER — Emergency Department (HOSPITAL_COMMUNITY)
Admission: EM | Admit: 2019-08-29 | Discharge: 2019-08-30 | Disposition: A | Discharge status: Home / Self Care | Payer: Medicaid Other | Attending: Emergency Medicine | Admitting: Emergency Medicine

## 2019-08-29 DIAGNOSIS — B370 Candidal stomatitis: Secondary | ICD-10-CM | POA: Diagnosis not present

## 2019-08-29 DIAGNOSIS — Z79899 Other long term (current) drug therapy: Secondary | ICD-10-CM | POA: Insufficient documentation

## 2019-08-29 DIAGNOSIS — D0007 Carcinoma in situ of tongue: Secondary | ICD-10-CM | POA: Insufficient documentation

## 2019-08-29 DIAGNOSIS — Z87891 Personal history of nicotine dependence: Secondary | ICD-10-CM | POA: Diagnosis not present

## 2019-08-29 NOTE — ED Triage Notes (Signed)
Patient states that 2 days ago, he noticed white patches on his mouth and lips. Patient denies pain, states some soreness. No itching or burning. No other symptoms.

## 2019-08-30 MED ORDER — NYSTATIN 100000 UNIT/ML MT SUSP: OROMUCOSAL | 0 refills | Status: AC

## 2019-08-30 MED ORDER — NYSTATIN 100000 UNIT/ML MT SUSP
5.0000 mL | Freq: Once | OROMUCOSAL | Status: AC
  Administered 2019-08-30: 500000 [IU] via ORAL
  Filled 2019-08-30: qty 5

## 2019-08-30 NOTE — ED Provider Notes (Signed)
[place] DEPT Provider Note: Georgena Spurling, MD, FACEP  CSN: [id number] MRN: [id number] ARRIVAL: 08/29/19 at 2230 ROOM: [place] PRESENT ILLNESS  08/30/19 12:19 AM Joel Mathis is a 39 y.o. male with a history of tongue cancer status post glossectomy.  He is here with 2 days of white patches on his gums and lips.  He describes this is sore but not overtly painful.  There is no itching or burning.  He denies any other symptoms.  He is not currently on steroids or undergoing chemotherapy.   Past Medical History:  Diagnosis Date  . Tongue cancer [facility])     Past Surgical History:  Procedure Laterality Date  . IR ANGIO EXTERNAL CAROTID SEL EXT CAROTID BILAT MOD SED  05/03/2017  . IR ANGIO EXTERNAL CAROTID SEL EXT CAROTID BILAT MOD SED  05/03/2017  . IR ANGIO INTRA EXTRACRAN SEL COM CAROTID INNOMINATE BILAT MOD SED  05/03/2017  . IR ANGIO INTRA EXTRACRAN SEL COM CAROTID INNOMINATE BILAT MOD SED  05/03/2017  . IR ANGIO VERTEBRAL SEL VERTEBRAL BILAT MOD SED  05/03/2017  . IR ANGIOGRAM FOLLOW UP STUDY  05/03/2017  . IR ANGIOGRAM FOLLOW UP STUDY  05/03/2017  . IR NEURO EACH ADD'L AFTER BASIC UNI LEFT (MS)  05/03/2017  . IR NEURO EACH ADD'L AFTER BASIC UNI LEFT (MS)  05/03/2017  . IR NEURO EACH ADD'L AFTER BASIC UNI RIGHT (MS)  05/03/2017  . IR NEURO EACH ADD'L AFTER BASIC UNI RIGHT (MS)  05/03/2017  . IR TRANSCATH/EMBOLIZ  05/03/2017  . IR TRANSCATH/EMBOLIZ  05/03/2017  . RADIOLOGY WITH ANESTHESIA N/A 05/03/2017   Procedure: IR WITH ANESTHESIA, angio of embolism;  Surgeon: Luanne Bras, MD;  Location: [place];  Service: Radiology;  Laterality: N/A;  . RADIOLOGY WITH ANESTHESIA N/A 05/03/2017   Procedure: IR WITH ANESTHESIA;  Surgeon: Luanne Bras, MD;  Location: [place];  Service: Radiology;  Laterality: N/A;  . TONGUE SURGERY      No family history on file.  Social History   Tobacco Use  . Smoking status: Former Smoker    Types:  Cigarettes  . Smokeless tobacco: Never Used  Substance Use Topics  . Alcohol use: Yes    Comment: occassional  . Drug use: Yes    Types: Cocaine, Marijuana    Prior to Admission medications   Medication Sig Start Date End Date Taking? Authorizing Provider  DULoxetine (CYMBALTA) 30 MG capsule Take 30 mg by mouth daily.    [provider]  HYDROCODONE-ACETAMINOPHEN PO Take 5 mLs by mouth every 6 (six) hours as needed (oral pain).    [provider]  Multiple Vitamins-Minerals (MULTIVITAMIN ADULT PO) Take 1 tablet by mouth daily.    [provider]  nystatin (MYCOSTATIN) 100000 UNIT/ML suspension Swish 5 mL around mouth and retain in mouth for several minutes before swallowing.  Do this 4 times a day. 08/30/19   Kailoni Vahle, MD  Zolpidem Tartrate (AMBIEN PO) Take 1 tablet by mouth at bedtime as needed (oral pain).    [provider]    Allergies Patient has no known allergies.   REVIEW OF SYSTEMS  Negative except as noted here or in the History of Present Illness.   PHYSICAL EXAMINATION  Initial Vital Signs Blood pressure (!) 137/96, pulse 94, temperature 97.9 F (36.6 C), temperature source Oral, resp. rate 20, height 6\' 1"  (1.854 m), weight 83.9 kg, SpO2 97 %.  Examination General: Well-developed, well-nourished  male in no acute distress; appearance consistent with age of record HENT: normocephalic; surgical resection of tongue and face; white patches on erythematous mucosa of gums and lips:    Eyes: Normal appearance Neck: supple Heart: regular rate and rhythm Lungs: clear to auscultation bilaterally Abdomen: soft; nondistended; nontender; bowel sounds present Extremities: No deformity; full range of motion Neurologic: Awake, alert and oriented; motor function intact in all extremities and symmetric; no facial droop Skin: Warm and dry Psychiatric: Normal mood and affect   RESULTS  Summary of this visit's results, reviewed and  interpreted by myself:   EKG Interpretation  Date/Time:    Ventricular Rate:    PR Interval:    QRS Duration:   QT Interval:    QTC Calculation:   R Axis:     Text Interpretation:        Laboratory Studies: No results found for this or any previous visit (from the past 24 hour(s)). Imaging Studies: No results found.  ED COURSE and MDM  Nursing notes, initial and subsequent vitals signs, including pulse oximetry, reviewed and interpreted by myself.  Vitals:   08/29/19 2246 08/29/19 2248  BP: (!) 137/96   Pulse: 94   Resp: 20   Temp: 97.9 F (36.6 C)   TempSrc: Oral   SpO2: 97%   Weight:  83.9 kg  Height:  6\' 1"  (1.854 m)   Medications  nystatin (MYCOSTATIN) 100000 UNIT/ML suspension 500,000 Units (has no administration in time range)    Swab sent for fungal culture.  KOH preps are no longer performed.  Will start on  PROCEDURES  Procedures   ED DIAGNOSES     ICD-10-CM   1. Ritta Slot, oral  B37.0        Jamine Highfill, Jenny Reichmann, MD 08/30/19 [phone]

## 2019-09-02 LAB — FUNGAL STAIN REFLEX

## 2019-09-02 LAB — FUNGUS STAIN
